# Patient Record
Sex: Male | Born: 1985 | Race: Black or African American | Hispanic: No | Marital: Single | Smoking: Former smoker
Health system: Southern US, Community
[De-identification: ages and names within clinical notes are randomized; demographics above are authoritative.]

## PROBLEM LIST (undated history)

## (undated) ENCOUNTER — Ambulatory Visit (HOSPITAL_COMMUNITY): Discharge status: Absent without leave | Payer: Self-pay

## (undated) DIAGNOSIS — T7840XA Allergy, unspecified, initial encounter: Secondary | ICD-10-CM

## (undated) HISTORY — PX: KNEE SURGERY: SHX244

## (undated) HISTORY — DX: Allergy, unspecified, initial encounter: T78.40XA

---
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
## 2005-11-24 ENCOUNTER — Emergency Department (HOSPITAL_COMMUNITY): Admission: EM | Admit: 2005-11-24 | Discharge: 2005-11-24 | Payer: Self-pay | Admitting: Family Medicine

## 2006-09-21 DIAGNOSIS — L708 Other acne: Secondary | ICD-10-CM | POA: Insufficient documentation

## 2006-09-21 DIAGNOSIS — G43909 Migraine, unspecified, not intractable, without status migrainosus: Secondary | ICD-10-CM | POA: Insufficient documentation

## 2009-03-31 ENCOUNTER — Telehealth: Payer: Self-pay | Admitting: *Deleted

## 2009-04-01 ENCOUNTER — Ambulatory Visit: Payer: Self-pay | Admitting: Family Medicine

## 2009-04-01 DIAGNOSIS — J309 Allergic rhinitis, unspecified: Secondary | ICD-10-CM | POA: Insufficient documentation

## 2009-05-26 ENCOUNTER — Ambulatory Visit: Payer: Self-pay | Admitting: Family Medicine

## 2009-06-16 ENCOUNTER — Encounter (INDEPENDENT_AMBULATORY_CARE_PROVIDER_SITE_OTHER): Payer: Self-pay | Admitting: *Deleted

## 2009-06-16 DIAGNOSIS — F17200 Nicotine dependence, unspecified, uncomplicated: Secondary | ICD-10-CM

## 2009-06-26 ENCOUNTER — Encounter: Payer: Self-pay | Admitting: Family Medicine

## 2009-09-23 ENCOUNTER — Ambulatory Visit: Payer: Self-pay | Admitting: Family Medicine

## 2009-09-23 DIAGNOSIS — A630 Anogenital (venereal) warts: Secondary | ICD-10-CM | POA: Insufficient documentation

## 2009-09-30 ENCOUNTER — Ambulatory Visit: Payer: Self-pay | Admitting: Family Medicine

## 2009-09-30 ENCOUNTER — Encounter: Payer: Self-pay | Admitting: Family Medicine

## 2009-10-08 ENCOUNTER — Ambulatory Visit: Payer: Self-pay | Admitting: Family Medicine

## 2009-10-15 ENCOUNTER — Ambulatory Visit: Payer: Self-pay | Admitting: Family Medicine

## 2009-10-15 ENCOUNTER — Encounter: Payer: Self-pay | Admitting: Family Medicine

## 2009-10-15 LAB — CONVERTED CEMR LAB: Chlamydia, Swab/Urine, PCR: NEGATIVE

## 2009-10-23 ENCOUNTER — Encounter: Payer: Self-pay | Admitting: Family Medicine

## 2009-10-28 ENCOUNTER — Ambulatory Visit: Payer: Self-pay | Admitting: Family Medicine

## 2009-11-03 ENCOUNTER — Encounter: Payer: Self-pay | Admitting: Family Medicine

## 2009-11-16 ENCOUNTER — Telehealth (INDEPENDENT_AMBULATORY_CARE_PROVIDER_SITE_OTHER): Payer: Self-pay | Admitting: *Deleted

## 2010-01-09 ENCOUNTER — Emergency Department (HOSPITAL_COMMUNITY)
Admission: EM | Admit: 2010-01-09 | Discharge: 2010-01-09 | Payer: Self-pay | Source: Home / Self Care | Admitting: Emergency Medicine

## 2010-01-29 ENCOUNTER — Ambulatory Visit: Payer: Self-pay | Admitting: Family Medicine

## 2010-01-29 DIAGNOSIS — M25519 Pain in unspecified shoulder: Secondary | ICD-10-CM

## 2010-02-04 ENCOUNTER — Ambulatory Visit: Payer: Self-pay | Admitting: Family Medicine

## 2010-02-04 DIAGNOSIS — R42 Dizziness and giddiness: Secondary | ICD-10-CM

## 2010-02-11 ENCOUNTER — Ambulatory Visit: Payer: Self-pay | Admitting: Family Medicine

## 2010-02-18 ENCOUNTER — Ambulatory Visit: Payer: Self-pay | Admitting: Family Medicine

## 2010-03-05 ENCOUNTER — Ambulatory Visit: Payer: Self-pay | Admitting: Family Medicine

## 2010-03-24 ENCOUNTER — Ambulatory Visit: Payer: Self-pay | Admitting: Family Medicine

## 2010-04-05 ENCOUNTER — Ambulatory Visit: Payer: Self-pay | Admitting: Family Medicine

## 2010-04-29 ENCOUNTER — Ambulatory Visit: Payer: Self-pay | Admitting: Family Medicine

## 2010-06-29 ENCOUNTER — Ambulatory Visit: Payer: Self-pay | Admitting: Family Medicine

## 2010-06-29 DIAGNOSIS — J329 Chronic sinusitis, unspecified: Secondary | ICD-10-CM | POA: Insufficient documentation

## 2010-07-01 ENCOUNTER — Emergency Department (HOSPITAL_COMMUNITY): Admission: EM | Admit: 2010-07-01 | Discharge: 2009-09-05 | Payer: Self-pay | Admitting: Emergency Medicine
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
## 2010-08-26 NOTE — Assessment & Plan Note (Signed)
Summary: apply solution for warts/eo   Vital Signs:  Patient profile:   25 year old male Height:      68.25 inches Weight:      187.2 pounds BMI:     28.36 Temp:     98.0 degrees F oral Pulse rate:   54 / minute BP sitting:   123 / 77  (right arm) Cuff size:   regular  Vitals Entered By: Colton Grams LPN (October 08, 2009 1:36 PM) CC: solution 4 warts Is Patient Diabetic? No Pain Assessment Patient in pain? no        Primary Care Provider:  Bobby Rumpf   CC:  solution 4 warts.  History of Present Illness: 1) Genital warts: Seen on 09/23/09 for genital warts. Started podophyllin application weekly. Reports some mild scaling and itching with podophyllin but warts have gotten smaller. Now using protection. Monogamous w/ girlfriend. Now using protection. Denies penile pain, discharge, fever, chills, weight loss, groin pain, lymph node swelling, oral lesions, lesions elsewhere.      Current Medications (verified): 1)  Cromolyn Sodium 4 % Soln (Cromolyn Sodium) .Marland Kitchen.. 1 Spray Each Nostril Three Times Daily For Allergic Rhinitis. Dispense Qs For 1 Month 2)  Diphenhydramine Hcl 50 Mg Caps (Diphenhydramine Hcl) .... Take One Pill By Mouth At Bedtime 3)  Flonase 50 Mcg/act Susp (Fluticasone Propionate) .... 2 Sprays Per Nostril Once A Day  Allergies (verified): No Known Drug Allergies  Physical Exam  General:  well nourished, well appearing, alert  Genitalia:  Testes bilaterally descended without nodularity, tenderness or masses. No scrotal masses or lesions. Four, now  ~60mm skin colored warty lesions along penile shaft, now dessicated appearing. Mild scaling at sites of application of podophyllin.    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 Will treat once a week x 1 month with podophyllin. Treatment #3 today. Advised that girlfriend shoud get yearly Pap smears, discuss with her OB/GYN getting  Gardisil. Advised to use protection with intercourse. Patient expressed understanding.   Orders: Wart Destruct <14 (17110)  Complete Medication List: 1)  Cromolyn Sodium 4 % Soln (Cromolyn sodium) .Marland Kitchen.. 1 spray each nostril three times daily for allergic rhinitis. dispense qs for 1 month 2)  Diphenhydramine Hcl 50 Mg Caps (Diphenhydramine hcl) .... Take one pill by mouth at bedtime 3)  Flonase 50 Mcg/act Susp (Fluticasone propionate) .... 2 sprays per nostril once a day   Procedure Note Last Tetanus: Done. (01/23/2004)  Cyst Removal:    Comment: Wart Removal: Date of onset: 07/27/2009 Onset of lesion: 2 months Indication: Genital warts  Consent signed: yes  Procedure # 1: wart destruction    Region: dorsal    Location: penis    Technique: topical podophylin    Anesthesia: none     Comment: Tolerated well.   Instructions: RTC in 7-10 days Additional Instructions: Treatment #3

## 2010-08-26 NOTE — Assessment & Plan Note (Signed)
Summary: apply solution for warts/eo   Vital Signs:  Patient profile:   25 year old male Height:      68.25 inches Weight:      191 pounds BMI:     28.93 Temp:     98.1 degrees F oral Pulse rate:   96 / minute BP sitting:   142 / 85  (right arm) Cuff size:   regular  Vitals Entered By: Garen Grams LPN (October 15, 2009 2:38 PM) CC: treatment for warts; allergic rhinitis Is Patient Diabetic? No Pain Assessment Patient in pain? no        Primary Care Provider:  Bobby Rumpf   CC:  treatment for warts; allergic rhinitis.  History of Present Illness: 1) Genital warts: Seen on 09/23/09 for genital warts. Started podophyllin application weekly. Reports some mild scaling and itching with podophyllin but warts have gotten smaller. Now using protection. Monogamous w/ girlfriend. Now using protection. Denies penile pain, discharge, fever, chills, weight loss, groin pain, lymph node swelling, oral lesions, lesions elsewhere. Would like other testing for STDs - last tests were approximately one year ago and were all negative per patient report.   2) Allergic rhinitis: Little relief with trial of flonase + nasalcrom + antihistamines (over the counter claritin and benadryl). Has tried Neti pot w/o relief. Went to ER  ~ one month ago for continued symptoms. Was told to stop taking Nasalcrom, Flonase, Benadryl and to start using Neti Pot. Daily symptoms all year round, but worse in spring. Mild sinus pressure w/o pain. Clear rhinorrhea and itchy, teary eyes. Denies fever, wheezing, dyspnea, weight loss, appetite change, ear pain, recent nasal or head trauma (though did have nasal fracture four years ago).    Habits & Providers  Alcohol-Tobacco-Diet     Tobacco Status: current  Current Medications (verified): 1)  Cromolyn Sodium 4 % Soln (Cromolyn Sodium) .Marland Kitchen.. 1 Spray Each Nostril Three Times Daily For Allergic Rhinitis. Dispense Qs For 1 Month 2)  Diphenhydramine Hcl 50 Mg Caps (Diphenhydramine  Hcl) .... Take One Pill By Mouth At Bedtime 3)  Flonase 50 Mcg/act Susp (Fluticasone Propionate) .... 2 Sprays Per Nostril Once A Day  Allergies (verified): No Known Drug Allergies  Physical Exam  General:  well nourished, well appearing, alert  Head:  no sinus tenderness  Eyes:  no conjunctivitis but copious clear discharge bilaterally  Ears:  clear TMs and canals bilaterally  Nose:  mild congestion, pale swollen mucosa w/ right nasal polyp  Mouth:  no erythema or exudate, some posterior pharyngeal mucus no oral lesions  Neck:  no lymphadenopathy   Lungs:  CTAB  Heart:  RRR, no murmurs  Abdomen:  Bowel sounds positive,abdomen soft and non-tender without masses, organomegaly or hernias noted. Genitalia:  Testes bilaterally descended without nodularity, tenderness or masses. No scrotal masses or lesions. Four, now  < 1mm skin colored warty lesions along penile shaft, now dessicated appearing. Mild scaling at sites of application of podophyllin.  Msk:  5/5 strength all extremities.  Pulses:  2+ radials  Extremities:  No clubbing, cyanosis, edema, or deformity noted with normal full range of motion of all joints.   Neurologic:  alert & oriented X3, cranial nerves II-XII intact, strength normal in all extremities, sensation intact to light touch, and DTRs symmetrical and normal.   Skin:  see genitalia exam o/w normal    Impression & Recommendations:  Problem # 1:  SEXUALLY TRANSMITTED DISEASE, EXPOSURE TO (ICD-V01.6) Assessment Comment Only At patient request will  check RPR, HIV, GC/CT (urine).  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 Will end podophyillin treatment today (s/p four treatments). Advised that girlfriend shoud get yearly Pap smears, discuss with her OB/GYN getting Gardisil. Advised to use protection with  intercourse. Patient expressed understanding.   Orders: [facility]- Est  Level 4 (99214) Wart Destruct <14 (17110)  Problem # 3:  ALLERGIC RHINITIS (ICD-477.9) Assessment: Unchanged  Will refer to ENT given failed treatment, nasal polyp on exam. Advised to continue medications until seen by ENT.   His updated medication list for this problem includes:    Diphenhydramine Hcl 50 Mg Caps (Diphenhydramine hcl) .Marland Kitchen... Take one pill by mouth at bedtime    Flonase 50 Mcg/act Susp (Fluticasone propionate) .Marland Kitchen... 2 sprays per nostril once a day  Orders: ENT Referral (ENT) [facility]- Est  Level 4 (57322)  Complete Medication List: 1)  Cromolyn Sodium 4 % Soln (Cromolyn sodium) .Marland Kitchen.. 1 spray each nostril three times daily for allergic rhinitis. dispense qs for 1 month 2)  Diphenhydramine Hcl 50 Mg Caps (Diphenhydramine hcl) .... Take one pill by mouth at bedtime 3)  Flonase 50 Mcg/act Susp (Fluticasone propionate) .... 2 sprays per nostril once a day  Patient Instructions: 1)  Go to ENT to be evaluated for your symptoms. We will call you with the referral. 2)  Follow up in 6 months.  3)  We will mail you a copy of all your test results.    Procedure Note Last Tetanus: Done. (01/23/2004)  Cyst Removal:    Comment: Date of onset: 07/27/2009 Onset of lesion: 2 months Indication: Genital warts (4) Consent signed: yes  Procedure # 1: wart destruction    Region: dorsal    Location: penis    Technique: topical podophylin    Anesthesia: none     Comment: Tolerated well.   Instructions: continue to follow Additional Instructions: Treatment #4

## 2010-08-26 NOTE — Assessment & Plan Note (Signed)
Summary: congestion,df   Vital Signs:  Patient profile:   25 year old male Height:      68.25 inches Weight:      180.7 pounds BMI:     27.37 Temp:     98.0 degrees F oral Pulse rate:   65 / minute BP sitting:   127 / 73  (left arm) Cuff size:   regular  Vitals Entered By: Garen Grams LPN (June 29, 2010 3:20 PM) CC: head congestion x 2 weeks Is Patient Diabetic? No Pain Assessment Patient in pain? no        Primary Care Provider:  Bobby Rumpf   CC:  head congestion x 2 weeks.  History of Present Illness: 1) Nasal / sinus congestion: x 2 weeks. Clear to yellow nasal discharge, productive cough with yellow sputum at times., sinus pressure. Symptoms worse indoors, but not affected by time of day. +itchy nose. +rhinorrhea. History of chronic allergic rhinitis. Not on medications currently - thinks he was on inhaled steroid which worked for his symptoms from ENT but ran out a while ago. Has been tried on NasalCrom,  over the counter antihistamines without relief.   ROS: Denies fever, chills, nausea, vomiting or diarrhea    Habits & Providers  Alcohol-Tobacco-Diet     Tobacco Status: never     Tobacco Counseling: to quit use of tobacco products     Cigarette Packs/Day: 0.25  Current Medications (verified): 1)  None  Allergies (verified): No Known Drug Allergies  Physical Exam  General:  well nourished, well appearing, alert  Head:  +sinus tenderness of maxillae bilaterally (not over frontal)   Eyes:  no conjunctivitis  Ears:  clear TMs and canals bilaterally  Nose:  +ve congestion, pale swollen mucosa w/ right nasal polyp  Mouth:  no erythema or exudate, +post nasal drip  Neck:  supple and full ROM.   Lungs:  CTAB    Impression & Recommendations:  Problem # 1:  SINUSITIS (ICD-473.9) Assessment New  Will treat as bacterial sinusitis given symptom duration. Will write for Flonase as well. Follow up as needed.  The following medications were removed from the  medication list:    Flonase 50 Mcg/act Susp (Fluticasone propionate) .Marland Kitchen... 2 sprays per nostril once a day His updated medication list for this problem includes:    Amoxicillin 875 Mg Tabs (Amoxicillin) ..... One tab by mouth two times a day x 10 days    Flonase 50 Mcg/act Susp (Fluticasone propionate) ..... One spray each nostril two times a day. one bottle  Orders: [facility]- Est Level  3 (16109)  Complete Medication List: 1)  Amoxicillin 875 Mg Tabs (Amoxicillin) .... One tab by mouth two times a day x 10 days 2)  Flonase 50 Mcg/act Susp (Fluticasone propionate) .... One spray each nostril two times a day. one bottle  Patient Instructions: 1)  Take the antibiotic as prescribed. 2)  Use the flonase a prescribed when you are able to afford it. Follow up as needed. 3)  You can take over the counter zyrtec or allegra for allergy symptoms as well.  Prescriptions: FLONASE 50 MCG/ACT SUSP (FLUTICASONE PROPIONATE) one spray each nostril two times a day. one bottle  #1 x 3   Entered and Authorized by:   Bobby Rumpf  MD   Signed by:   Bobby Rumpf  MD on 06/29/2010   Method used:   Print then Give to Patient   RxID:   [id number] AMOXICILLIN 875 MG TABS (AMOXICILLIN)  one tab by mouth two times a day x 10 days  #20 x 0   Entered and Authorized by:   Bobby Rumpf  MD   Signed by:   Bobby Rumpf  MD on 06/29/2010   Method used:   Print then Give to Patient   RxID:   [id number]    Orders Added: 1)  [facility]- Est Level  3 [14782]

## 2010-08-26 NOTE — Assessment & Plan Note (Signed)
Summary: f/u eo   Vital Signs:  Patient profile:   25 year old male Height:      68.25 inches Weight:      175.2 pounds BMI:     26.54 Temp:     97.6 degrees F oral Pulse rate:   64 / minute BP sitting:   133 / 84  (left arm) Cuff size:   regular  Vitals Entered By: Garen Grams LPN (March 05, 2010 9:03 AM) CC: wart treatment Is Patient Diabetic? No Pain Assessment Patient in pain? no        Primary Care Provider:  Bobby Rumpf   CC:  wart treatment.  History of Present Illness: 1) Genital warts: History of genital warts treated successfully with podophyllin this year, now warts have returned in the same places. Treated one week ago (4th treatment in series). Monogamous with girlfriend. Lesions had been improving in size but appear static since last treatment  Denies penile pain, discharge, fever, chills, weight loss, groin pain, lymph node swelling, oral lesions, lesions elsewhere.  see prior meds for med rec.  ROS as above     Habits & Providers  Alcohol-Tobacco-Diet     Tobacco Status: never  Allergies: No Known Drug Allergies  Physical Exam  General:  well nourished, well appearing, alert  Genitalia:  Testes bilaterally descended without nodularity, tenderness or masses. No scrotal masses or lesions. 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 discussed other potential treatments including freezing. Podophyillin treatment today (5th treatment) . Advised regarding safe sexual practices. Follow up one week for re-treatment.   Orders: Post-Op Check (47829)  Complete Medication List: 1)  Cromolyn Sodium 4 % Soln (Cromolyn sodium) .Marland Kitchen.. 1 spray each nostril three times daily for allergic rhinitis. dispense qs for 1 month 2)  Diphenhydramine Hcl 50 Mg Caps (Diphenhydramine hcl) .... Take  one pill by mouth at bedtime 3)  Flonase 50 Mcg/act Susp (Fluticasone propionate) .... 2 sprays per nostril once a day 4)  Podophyllin  .... Apply in office q week for treatment 5)  Ibuprofen 800 Mg Tabs (Ibuprofen) .... One tab by mouth three times a day as needed pain 6)  Flexeril 5 Mg Tabs (Cyclobenzaprine hcl) .... One tab by mouth at bedtime as needed for shoulder stiffness or pain Prescriptions: FLEXERIL 5 MG TABS (CYCLOBENZAPRINE HCL) one tab by mouth at bedtime as needed for shoulder stiffness or pain  #30 x 1   Entered and Authorized by:   Bobby Rumpf  MD   Signed by:   Bobby Rumpf  MD on 03/05/2010   Method used:   Electronically to        [facility] [phone]* (retail)       [address]       [place], [place]  [address]       Ph: [phone]       Fax: [phone]   RxID:   [id number] IBUPROFEN 800 MG TABS (IBUPROFEN) one tab by mouth three times a day as needed pain  #30 x 1   Entered and Authorized by:   Bobby Rumpf  MD   Signed by:   Bobby Rumpf  MD on 03/05/2010   Method used:   Electronically to        [facility] [phone]* (retail)  [address]       [place], [place]  [address]       Ph: [phone]       Fax: [phone]   RxID:   [id number]    Procedure Note Last Tetanus: Done. (01/23/2004)  Cyst Removal: The patient denies swelling.  Wart Removal:    Comment: Comment: Procedure # 1: wart destruction    Technique: topical podophylin    Comment: Appplied topical podophyllin to penile warts. Tolerated procedure well. Advised regarding aftercare. (5th treatment)

## 2010-08-26 NOTE — Assessment & Plan Note (Signed)
Summary: f/up,tcb   Primary Care Provider:  Bobby Rumpf   CC:  genital warts .  History of Present Illness: 1) Genital warts: History of genital warts treated successfully with podophyllin this year, now warts have returned in the same places. Treated one week ago (7th treatment in series). Monogamous with girlfriend. Lesions had been improving in size but had appeared static after 4th treatment, now have begun to improve again per patient with podophyllin / liquid N2 application  Denies penile pain, discharge, fever, chills, weight loss, groin pain, lymph node swelling, oral lesions, lesions elsewhere.   2) Prevention: Flu shot today. Requests dental appointment for general check.   see prior meds for med rec.  ROS as above     Current Medications (verified): 1)  Cromolyn Sodium 4 % Soln (Cromolyn Sodium) .Marland Kitchen.. 1 Spray Each Nostril Three Times Daily For Allergic Rhinitis. Dispense Qs For 1 Month 2)  Diphenhydramine Hcl 50 Mg Caps (Diphenhydramine Hcl) .... Take One Pill By Mouth At Bedtime 3)  Flonase 50 Mcg/act Susp (Fluticasone Propionate) .... 2 Sprays Per Nostril Once A Day 4)  Podophyllin .... Apply in Office Q Week For Treatment 5)  Ibuprofen 800 Mg Tabs (Ibuprofen) .... One Tab By Mouth Three Times A Day As Needed Pain 6)  Flexeril 5 Mg Tabs (Cyclobenzaprine Hcl) .... One Tab By Mouth At Bedtime As Needed For Shoulder Stiffness or Pain  Allergies (verified): No Known Drug Allergies  Past History:  Past Medical History: Last updated: 09/23/2009 nasal fracture 10/28/04 chronic rhinitis   Family History: Last updated: 09/21/2006 Father -  14  healthy, Mother - 21 ; HTN  and asthma  Physical Exam  General:  well nourished, well appearing, alert  Mouth:  good dentition, mild moderate plaque, no caries noted  Genitalia:  Testes bilaterally descended without nodularity, tenderness or masses. No scrotal masses or lesions. 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 Advised regarding safe sexual practices. Will conclude treatment today.   Orders: Post-Op Check ([address])  Problem # 2:  Preventive Health Care (ICD-V70.0) Flu shot today, dental referral for general exam.   Complete Medication List: 1)  Cromolyn Sodium 4 % Soln (Cromolyn sodium) .Marland Kitchen.. 1 spray each nostril three times daily for allergic rhinitis. dispense qs for 1 month 2)  Diphenhydramine Hcl 50 Mg Caps (Diphenhydramine hcl) .... Take one pill by mouth at bedtime 3)  Flonase 50 Mcg/act Susp (Fluticasone propionate) .... 2 sprays per nostril once a day 4)  Podophyllin  .... Apply in office q week for treatment 5)  Ibuprofen 800 Mg Tabs (Ibuprofen) .... One tab by mouth three times a day as needed pain 6)  Flexeril 5 Mg Tabs (Cyclobenzaprine hcl) .... One tab by mouth at bedtime as needed for shoulder stiffness or pain  Other Orders: Dental Referral (Dentist)  Appended Document: f/up,tcb   Orders Added: 1)  Influenza Vaccine NON MCR [00028]    Immunizations Administered:  Influenza Vaccine # 1:    Vaccine Type: Fluvax Non-MCR    Site: right deltoid    Mfr: GlaxoSmithKline    Dose: 0.5 ml    Route: IM    Given by: Jone Baseman CMA    Exp. Date: 01/19/2011    Lot #: [id number]  VIS given: 02/16/10 version given April 29, 2010.  Flu Vaccine Consent Questions:    Do you have a history of severe allergic reactions to this vaccine? no    Any prior history of allergic reactions to egg and/or gelatin? no    Do you have a sensitivity to the preservative Thimersol? no    Do you have a past history of Guillan-Barre Syndrome? no    Do you currently have an acute febrile illness? no    Have you ever had a severe reaction to latex? no    Vaccine  information given and explained to patient? yes

## 2010-08-26 NOTE — Miscellaneous (Signed)
Summary: Orders Update  Clinical Lists Changes  Orders: Added new Service order of Wart Destruct <14 ([address]) - Signed

## 2010-08-26 NOTE — Assessment & Plan Note (Signed)
Summary: F/U/KH   Primary Care [name]:  Colton Flores    History of Present Illness: 1) Genital warts: History of genital warts treated successfully with podophyllin this year, now warts have returned in the same places. Treated two wees ago (5th treatment in series). Monogamous with girlfriend. Lesions had been improving in size but had appeared static after 4th treatment, now have begun to improve again per patient.  Denies penile pain, discharge, fever, chills, weight loss, groin pain, lymph node swelling, oral lesions, lesions elsewhere.  see prior meds for med rec.  ROS as above     Current Medications (verified): 1)  Cromolyn Sodium 4 % Soln (Cromolyn Sodium) .Marland Kitchen.. 1 Spray Each Nostril Three Times Daily For Allergic Rhinitis. Dispense Qs For 1 Month 2)  Diphenhydramine Hcl 50 Mg Caps (Diphenhydramine Hcl) .... Take One Pill By Mouth At Bedtime 3)  Flonase 50 Mcg/act Susp (Fluticasone Propionate) .... 2 Sprays Per Nostril Once A Day 4)  Podophyllin .... Apply in Office Q Week For Treatment 5)  Ibuprofen 800 Mg Tabs (Ibuprofen) .... One Tab By Mouth Three Times A Day As Needed Pain 6)  Flexeril 5 Mg Tabs (Cyclobenzaprine Hcl) .... One Tab By Mouth At Bedtime As Needed For Shoulder Stiffness or Pain  Allergies (verified): No Known Drug Allergies  Physical Exam  Genitalia:  Testes bilaterally descended without nodularity, tenderness or masses. No scrotal masses or lesions. Three (initially 4) skin colored warty lesions along penile shaft (largest of which was 3 mm in size now 1.5 mm in size Additional Exam:    Comment: Comment: Procedure # 1: wart destruction    Technique: topical podophylin, liquid N2 freeze    Comment: Appplied topical podophyllin to penile warts. Used cotton swab to apply liquid N2 to warts with 3 freeze / thaw cycles. Tolerated procedure well. Advised regarding aftercare. (6th treatment)   Impression & Recommendations:  Problem # 1:  CONDYLOMA ACUMINATUM  (ICD-078.11) Assessment Unchanged  Improving slowly. Podophyillin treatment today (5th treatment) . Concurrent N2 liquid freze. Advised regarding safe sexual practices. Follow up one week for re-treatment.   Orders: Post-Op Check (02542)  Complete Medication List: 1)  Cromolyn Sodium 4 % Soln (Cromolyn sodium) .Marland Kitchen.. 1 spray each nostril three times daily for allergic rhinitis. dispense qs for 1 month 2)  Diphenhydramine Hcl 50 Mg Caps (Diphenhydramine hcl) .... Take one pill by mouth at bedtime 3)  Flonase 50 Mcg/act Susp (Fluticasone propionate) .... 2 sprays per nostril once a day 4)  Podophyllin  .... Apply in office q week for treatment 5)  Ibuprofen 800 Mg Tabs (Ibuprofen) .... One tab by mouth three times a day as needed pain 6)  Flexeril 5 Mg Tabs (Cyclobenzaprine hcl) .... One tab by mouth at bedtime as needed for shoulder stiffness or pain

## 2010-08-26 NOTE — Assessment & Plan Note (Signed)
Summary: apply solution for warts/eo   Allergies: No Known Drug Allergies   Complete Medication List: 1)  Cromolyn Sodium 4 % Soln (Cromolyn sodium) .Marland Kitchen.. 1 spray each nostril three times daily for allergic rhinitis. dispense qs for 1 month 2)  Diphenhydramine Hcl 50 Mg Caps (Diphenhydramine hcl) .... Take one pill by mouth at bedtime 3)  Flonase 50 Mcg/act Susp (Fluticasone propionate) .... 2 sprays per nostril once a day  Other Orders: No Charge Patient Arrived (NCPA0) (NCPA0)
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
## 2010-08-26 NOTE — Miscellaneous (Signed)
Summary: Re: ENT referral  Clinical Lists Changes   received notification from [facility] that they are unable to complete referral for ENT at this time due to lack of volunteer physicians in this speciality group. they will notify patient when they can process referral. Theresia Lo RN  November 03, 2009 9:04 AM

## 2010-08-26 NOTE — Assessment & Plan Note (Signed)
Summary: apply solution for warts/eo   Vital Signs:  Patient profile:   25 year old male Height:      68.25 inches Weight:      188 pounds BMI:     28.48 Temp:     98.1 degrees F oral Pulse rate:   53 / minute BP sitting:   127 / 84  (right arm) Cuff size:   regular  Vitals Entered By: Garen Grams LPN (September 30, 452 1:54 PM) CC: f/u warts Is Patient Diabetic? No Pain Assessment Patient in pain? no        Primary Care Provider:  Bobby Rumpf   CC:  f/u warts.  History of Present Illness: 1) Genital warts: Seen on 09/23/09 for genital warts. Started podophyllin application weekly. Reports some mild scaling and itching with podophyllin but warts have gotten smaller. Now using protection. Here with his girlfrien; she gets yearly Pap smears and they have been normal. Not immunized with Gardisil. Monogamous w/ girlfriend. Does not use protection. Denies penile pain, discharge, fever, chills, weight loss, groin pain, lymph node swelling, oral lesions, lesions elsewhere.      Allergies: No Known Drug Allergies  Physical Exam  General:  well nourished, well appearing, alert  Genitalia:  Testes bilaterally descended without nodularity, tenderness or masses. No scrotal masses or lesions. Four, now  ~22mm skin colored warty lesions along penile shaft, now dessicated appearing. Mild scaling at sites of application of podophyllin.  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 Treatment #2 today. Advised that girlfriend shoud get yearly Pap smears, discuss with her OB/GYN getting Gardisil. Advised to use protection with intercourse. Patient expressed understanding.   Complete Medication List: 1)  Cromolyn Sodium 4 % Soln (Cromolyn sodium) .Marland Kitchen.. 1 spray each nostril three times daily for allergic rhinitis. dispense qs for 1 month 2)   Diphenhydramine Hcl 50 Mg Caps (Diphenhydramine hcl) .... Take one pill by mouth at bedtime 3)  Flonase 50 Mcg/act Susp (Fluticasone propionate) .... 2 sprays per nostril once a day   Procedure Note Last Tetanus: Done. (01/23/2004)  Wart Removal: Date of onset: 07/27/2009 Onset of lesion: 2 months Indication: Genital warts  Consent signed: yes  Procedure # 1: wart destruction    Region: dorsal    Location: penis    Technique: topical podophylin    Anesthesia: none     Comment: Tolerated well.   Instructions: RTC in 7-10 days Additional Instructions: Treatment #2

## 2010-08-26 NOTE — Assessment & Plan Note (Signed)
Summary: Colton Flores,Colton Flores   Vital Signs:  Patient profile:   25 year old male Height:      68.25 inches Weight:      188.4 pounds BMI:     28.54 Temp:     98.2 degrees F oral Pulse rate:   70 / minute BP sitting:   142 / 84  (left arm) Cuff size:   regular  Vitals Entered By: Garen Grams LPN (September 23, 1608 1:47 PM) CC: Colton Flores Is Patient Diabetic? No Pain Assessment Patient in pain? no        Primary Care Provider:  Bobby Rumpf   CC:  Colton Flores.  History of Present Illness: 1) Chronic rhinitis: Went to ER  ~ one month ago for continued symptoms. Was told to stop taking Nasalcrom, Flonase, Benadryl and to start using Neti Pot. Noticed some relief with this initially then symptoms returned. Was referred to ENT but did not go. Has had some relief as well with above medications (was not always compliant) but still having daily symptoms.  Mild sinus pressure w/o pain. Clear rhinorrhea and itchy, teary eyes. Denies fever, wheezing, dyspnea, weight loss, appetite change, ear pain, recent nasal or head trauma (though did have nasal fracture four years ago).   2) Tobacco use: Went to a hookah bar x 1. Does not smoke chronically  3) "Bumps" on penis: Reports 4 small "mushroom"-like bumps on penis. First appeared 2 months ago. Painless. Sexually active. Monogamous w/ girlfriend. Does not use protection. Denies penile pain, discharge, fever, chills, weight loss, groin pain, lymph node swelling, oral lesions, lesions elsewhere.      Habits & Providers  Alcohol-Tobacco-Diet     Tobacco Status: current     Cigarette Packs/Day: 0.25  Current Medications (verified): 1)  Cromolyn Sodium 4 % Soln (Cromolyn Sodium) .Marland Kitchen.. 1 Spray Each Nostril Three Times Daily For Allergic Rhinitis. Dispense Qs For 1 Month 2)  Diphenhydramine Hcl 50 Mg Caps (Diphenhydramine Hcl) .... Take One Pill By Mouth At Bedtime 3)  Flonase 50 Mcg/act Susp (Fluticasone Propionate) .... 2 Sprays Per Nostril Once A Day  Allergies  (verified): No Known Drug Allergies  Past History:  Family History: Last updated: 09/21/2006 Father -  37  healthy, Mother - 39 ; HTN  and asthma  Social History: Last updated: 09/23/2009 Lives in [place] w/ his mother. 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 Plays flag football for fun, has started running as well. Trying to get a job as well.  Hookah bar x 1; does not smoke or use other tobacco products otherwise. Sexually active, monogamous w/ girlfriend, does not use protection.  Enjoys sports and video games.   Past Medical History: nasal fracture 10/28/04 chronic rhinitis   Past Surgical History: none   Social History: Lives in [place] w/ his mother. 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 Plays flag football for fun, has started running as well. Trying to get a job as well.  Hookah bar x 1; does not smoke or use other tobacco products otherwise. Sexually active, monogamous w/ girlfriend, does not use protection.  Enjoys sports and video games. Packs/Day:  0.25  Physical Exam  General:  well nourished, well appearing, alert  Head:  no sinus tenderness  Eyes:  no conjunctivitis but copious clear discharge bilaterally  Ears:  clear TMs and canals bilaterally  Nose:  mild congestion, swollen mucosa w/ rigfht nasal polyp  Mouth:  no erythema or exudate, some posterior pharyngeal mucus and cobblestoning  no oral lesions  Neck:  no lymphadenopathy   Lungs:  CTAB  Heart:  RRR, no murmurs  Abdomen:  Bowel sounds positive,abdomen soft and non-tender without masses, organomegaly or hernias noted. Rectal:    Genitalia:  Testes bilaterally descended without nodularity, tenderness or masses. No scrotal masses or lesions. Four  ~ 2mm skin colored warty lesions along penile shaft.  Msk:  5/5 strength all extremities.  Pulses:  2+ radials  Extremities:  No clubbing, cyanosis, edema, or deformity noted with normal full range of motion of all joints.   Neurologic:  alert & oriented X3, cranial nerves II-XII  intact, strength normal in all extremities, sensation intact to light touch, and DTRs symmetrical and normal.   Skin:  see genitalia exam o/w normal  Inguinal Nodes:  No groin lymphadenopathy     Impression & Recommendations:  Problem # 1:  ALLERGIC RHINITIS (ICD-477.9) Assessment Unchanged Advised to restart Nasalcrom, flonase, benadryl at night. Will follow one month. Consider referral to ENT if not improving.  His updated medication list for this problem includes:    Diphenhydramine Hcl 50 Mg Caps (Diphenhydramine hcl) .Marland Kitchen... Take one pill by mouth at bedtime    Flonase 50 Mcg/act Susp (Fluticasone propionate) .Marland Kitchen... 2 sprays per nostril once a day  Problem # 2:  TOBACCO USER (ICD-305.1) Assessment: New Single visit to hookah bar. Advised against tobacco products. Patient in agreement.   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 Advised that girlfriend shoud get yearly Pap smears, discuss with her OB/GYN getting Gardisil if she has not already done so. Advised to use protection with intercourse. Patient expressed understanding.   Problem # 4:  PHYSICAL EXAMINATION (ICD-V70.0) Assessment: Comment Only  Orders: [facility] - Est  18-39 yrs (64403)  Reviewed preventive care protocols, scheduled due services, and updated immunizations.  Complete Medication List: 1)  Cromolyn Sodium 4 % Soln (Cromolyn sodium) .Marland Kitchen.. 1 spray each nostril three times daily for allergic rhinitis. dispense qs for 1 month 2)  Diphenhydramine Hcl 50 Mg Caps (Diphenhydramine hcl) .... Take one pill by mouth at bedtime 3)  Flonase 50 Mcg/act Susp (Fluticasone propionate) .... 2 sprays per nostril once a day  Patient Instructions: 1)  Wash off solution from penis in 6 hours.  2)  Come back in once a week for the next four weeks to have solution for warts applied. 3)  You may have some mild skin irritation from this solution. Call if you have a  severe reaction to it 4)  Use your nasal sprays as prescribed.  5)  Let your girlfriend know that you have warts and that she should get checked with yearly Pap smears and she can also get the HPV vaccine to help prevent cervical cancer (HPV is the virus that causes warts and can cause cervical cancer in women) 6)  Use condoms for protection when you have intercourse.

## 2010-08-26 NOTE — Assessment & Plan Note (Signed)
Summary: genital warts treatment #4/[facility]   Vital Signs:  Patient profile:   25 year old male Weight:      177.4 pounds  Vitals Entered By: Dennison Nancy RN (February 18, 2010 11:03 AM)  Primary Care Provider:  Bobby Rumpf   CC:  genital warts .  History of Present Illness: 1) Genital warts: History of genital warts treated successfully with podophyllin this year, now warts have returned in the same places. Treated one week ago (3rd treatment in series). Monogamous with girlfriend. Lesions are improving in size.   Denies penile pain, discharge, fever, chills, weight loss, groin pain, lymph node swelling, oral lesions, lesions elsewhere.  see prior meds for med rec.  ROS as above     Current Medications (verified): 1)  Cromolyn Sodium 4 % Soln (Cromolyn Sodium) .Marland Kitchen.. 1 Spray Each Nostril Three Times Daily For Allergic Rhinitis. Dispense Qs For 1 Month 2)  Diphenhydramine Hcl 50 Mg Caps (Diphenhydramine Hcl) .... Take One Pill By Mouth At Bedtime 3)  Flonase 50 Mcg/act Susp (Fluticasone Propionate) .... 2 Sprays Per Nostril Once A Day 4)  Podophyllin .... Apply in Office Q Week For Treatment  Allergies (verified): No Known Drug Allergies  Physical Exam  General:  well nourished, well appearing, alert  Genitalia:  Testes bilaterally descended without nodularity, tenderness or masses. No scrotal masses or lesions. Three (initially 4) now < 1 mm skin colored warty lesions along penile shaft (largest of which was 3 mm in size)  Additional Exam:    Comment: Procedure # 1: wart destruction    Technique: topical podophylin    Comment: Appplied topical podophyllin to penile warts. Tolerated procedure well. Advised regarding aftercare. 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 Podophyillin treatment today (4th treatment) . Advised regarding safe sexual practices. Follow up one week for re-treatment.   Orders: Post-Op  Check (14782)  Complete Medication List: 1)  Cromolyn Sodium 4 % Soln (Cromolyn sodium) .Marland Kitchen.. 1 spray each nostril three times daily for allergic rhinitis. dispense qs for 1 month 2)  Diphenhydramine Hcl 50 Mg Caps (Diphenhydramine hcl) .... Take one pill by mouth at bedtime 3)  Flonase 50 Mcg/act Susp (Fluticasone propionate) .... 2 sprays per nostril once a day 4)  Podophyllin  .... Apply in office q week for treatment

## 2010-08-26 NOTE — Progress Notes (Signed)
Summary: Ref Req  Phone Note Call from Patient Call back at Home Phone [phone] Call back at [phone]   Caller: mom-Tonya Reason for Call: Acute Illness Summary of Call: Has found a ENT for him to see. She is going to pay out of pocket but still may need fererral from [place].  The doctor is Hermelinda Medicus at [phone].  They have already an appt for this Friday at 1:00 can we send any records that they may need. Initial call taken by: Clydell Hakim,  November 16, 2009 9:10 AM  Follow-up for Phone Call        called Dr. Haroldine Laws office and agve referral  and records faxed. Follow-up by: Theresia Lo RN,  November 16, 2009 10:14 AM

## 2010-08-26 NOTE — Miscellaneous (Signed)
Summary: ROI  ROI   Imported By: Bradly Bienenstock 10/23/2009 10:17:37  _____________________________________________________________________  External Attachment:    Type:   Image     Comment:   External Document

## 2010-08-26 NOTE — Assessment & Plan Note (Signed)
Summary: f/u eo   Primary Care Provider:  Bobby Rumpf    History of Present Illness: 1) Genital warts: History of genital warts treated successfully with podophyllin this year, now warts have returned in the same places. Treated one week ago (6th treatment in series). Monogamous with girlfriend. Lesions had been improving in size but had appeared static after 4th treatment, now have begun to improve again per patient with podophyllin / liquid N2 application  Denies penile pain, discharge, fever, chills, weight loss, groin pain, lymph node swelling, oral lesions, lesions elsewhere.  see prior meds for med rec.  ROS as above     Current Medications (verified): 1)  Cromolyn Sodium 4 % Soln (Cromolyn Sodium) .Marland Kitchen.. 1 Spray Each Nostril Three Times Daily For Allergic Rhinitis. Dispense Qs For 1 Month 2)  Diphenhydramine Hcl 50 Mg Caps (Diphenhydramine Hcl) .... Take One Pill By Mouth At Bedtime 3)  Flonase 50 Mcg/act Susp (Fluticasone Propionate) .... 2 Sprays Per Nostril Once A Day 4)  Podophyllin .... Apply in Office Q Week For Treatment 5)  Ibuprofen 800 Mg Tabs (Ibuprofen) .... One Tab By Mouth Three Times A Day As Needed Pain 6)  Flexeril 5 Mg Tabs (Cyclobenzaprine Hcl) .... One Tab By Mouth At Bedtime As Needed For Shoulder Stiffness or Pain  Allergies (verified): No Known Drug Allergies  Physical Exam  General:  well nourished, well appearing, alert  Genitalia:  Testes bilaterally descended without nodularity, tenderness or masses. No scrotal masses or lesions. Two (initially 4) skin colored warty lesions along penile shaft (largest of which was 3 mm in size) now 1 mm in size Additional Exam:    Comment: Comment: Procedure # 1: wart destruction    Technique: topical podophylin, liquid N2 freeze    Comment: Appplied topical podophyllin to penile warts. Used cotton swab to apply liquid N2 to warts with 3 freeze / thaw cycles. Tolerated procedure well. Advised regarding aftercare. 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 Advised regarding safe sexual practices. Follow up one week for re-treatment.   Orders: Post-Op Check (04540)  Complete Medication List: 1)  Cromolyn Sodium 4 % Soln (Cromolyn sodium) .Marland Kitchen.. 1 spray each nostril three times daily for allergic rhinitis. dispense qs for 1 month 2)  Diphenhydramine Hcl 50 Mg Caps (Diphenhydramine hcl) .... Take one pill by mouth at bedtime 3)  Flonase 50 Mcg/act Susp (Fluticasone propionate) .... 2 sprays per nostril once a day 4)  Podophyllin  .... Apply in office q week for treatment 5)  Ibuprofen 800 Mg Tabs (Ibuprofen) .... One tab by mouth three times a day as needed pain 6)  Flexeril 5 Mg Tabs (Cyclobenzaprine hcl) .... One tab by mouth at bedtime as needed for shoulder stiffness or pain

## 2010-10-10 LAB — POCT I-STAT, CHEM 8
Calcium, Ion: 1.19 mmol/L (ref 1.12–1.32)
Chloride: 107 mEq/L (ref 96–112)
Glucose, Bld: 122 mg/dL — ABNORMAL HIGH (ref 70–99)
Hemoglobin: 15.3 g/dL (ref 13.0–17.0)
Potassium: 3.9 mEq/L (ref 3.5–5.1)
TCO2: 26 mmol/L (ref 0–100)

## 2010-10-10 LAB — CBC
HCT: 43.9 % (ref 39.0–52.0)
Platelets: 211 10*3/uL (ref 150–400)
RBC: 5.4 MIL/uL (ref 4.22–5.81)
WBC: 7.8 10*3/uL (ref 4.0–10.5)

## 2010-10-10 LAB — COMPREHENSIVE METABOLIC PANEL
ALT: 70 U/L — ABNORMAL HIGH (ref 0–53)
AST: 35 U/L (ref 0–37)
Albumin: 3.9 g/dL (ref 3.5–5.2)
Alkaline Phosphatase: 72 U/L (ref 39–117)
BUN: 15 mg/dL (ref 6–23)
GFR calc non Af Amer: 59 mL/min — ABNORMAL LOW (ref >60)
Potassium: 3.9 mEq/L (ref 3.5–5.1)
Sodium: 137 mEq/L (ref 135–145)
Total Bilirubin: 0.4 mg/dL (ref 0.3–1.2)

## 2010-10-10 LAB — URINALYSIS, ROUTINE W REFLEX MICROSCOPIC
Hgb urine dipstick: NEGATIVE
Nitrite: NEGATIVE
Specific Gravity, Urine: 1.028 (ref 1.005–1.030)
Urobilinogen, UA: 0.2 mg/dL (ref 0.0–1.0)
pH: 7.5 (ref 5.0–8.0)

## 2010-10-10 LAB — PROTIME-INR: INR: 1.1 (ref 0.00–1.49)

## 2010-10-10 LAB — RAPID URINE DRUG SCREEN, HOSP PERFORMED
Barbiturates: NOT DETECTED
Benzodiazepines: NOT DETECTED
Cocaine: NOT DETECTED

## 2010-10-10 LAB — ABO/RH: ABO/RH(D): A POS

## 2010-10-10 LAB — TYPE AND SCREEN: Antibody Screen: NEGATIVE

## 2010-10-10 LAB — LACTIC ACID, PLASMA: Lactic Acid, Venous: 2 mmol/L (ref 0.5–2.2)

## 2010-11-17 ENCOUNTER — Ambulatory Visit (INDEPENDENT_AMBULATORY_CARE_PROVIDER_SITE_OTHER): Payer: Self-pay | Admitting: Family Medicine

## 2010-11-17 ENCOUNTER — Encounter: Payer: Self-pay | Admitting: Family Medicine

## 2010-11-17 VITALS — BP 129/82 | HR 69 | Temp 98.0°F | Ht 67.0 in | Wt 188.2 lb

## 2010-11-17 DIAGNOSIS — Z20828 Contact with and (suspected) exposure to other viral communicable diseases: Secondary | ICD-10-CM

## 2010-11-17 DIAGNOSIS — J329 Chronic sinusitis, unspecified: Secondary | ICD-10-CM

## 2010-11-17 MED ORDER — FLUTICASONE PROPIONATE 50 MCG/ACT NA SUSP: 2.0000 | Freq: Two times a day (BID) | NASAL | Status: DC

## 2010-11-17 NOTE — Progress Notes (Signed)
  Subjective:    Patient ID: Colton Flores, male    DOB: August 13, 1985, 25 y.o.   MRN: [id number]  HPI  1) Chronic rhinitis: Relief with Flonase, but ran out several months ago. Has tried Zyrtec and Claritin without relief. Has been referred to ENT in the past as well without further recommendations. Symptoms worse indoors and in colder weather. Reports sneezing, postnasal drip, clear rhinorrhea. Denies fever, chills, sinus pain. Has been on Nasalcrom, Benadryl, Zyrtec, Claritin, nasal saline.   2) STD testing: Would like STD testing - gets tested yearly. History of genital warts (treated). Denies fever, chills, weight loss, night sweats, penile lesions, discharge. Sexually active, monogamous with girlfriend.   Pertinent history reviewed.   Review of Systems As per HPI      Objective:   Physical Exam General:  well nourished, well appearing, alert  Ears:  clear TMs and canals bilaterally  Nose:  mild congestion, pale mucosa  Mouth:  no erythema or exudate, some posterior pharyngeal mucus  Neck:  no lymphadenopathy   Lungs:  CTAB  Penis: no lesions or urethral discharge Lymph: no groin adenopathy        Assessment & Plan:

## 2010-11-17 NOTE — Assessment & Plan Note (Signed)
Testing for HIV, syphilis, hepatitis B, GC, CT, HIV today. Normal exam and no symptoms.

## 2010-11-17 NOTE — Assessment & Plan Note (Signed)
Refilled Flonase. Advised to avoid triggers. Follow up as needed.

## 2010-11-17 NOTE — Patient Instructions (Signed)
I will let you know your results.  I do not think that that was a wart. Follow up if you notice any new warts Use the Flonase as directed.   - Dr. Wallene Huh

## 2010-11-18 LAB — HEPATITIS B SURFACE ANTIGEN: Hepatitis B Surface Ag: NEGATIVE

## 2010-11-18 LAB — RPR

## 2010-11-18 LAB — HIV ANTIBODY (ROUTINE TESTING W REFLEX): HIV: NONREACTIVE

## 2010-11-19 LAB — GC/CHLAMYDIA PROBE AMP, GENITAL: Chlamydia, DNA Probe: NEGATIVE

## 2010-11-23 ENCOUNTER — Encounter: Payer: Self-pay | Admitting: Family Medicine

## 2011-04-14 ENCOUNTER — Ambulatory Visit (INDEPENDENT_AMBULATORY_CARE_PROVIDER_SITE_OTHER): Payer: Self-pay | Admitting: Family Medicine

## 2011-04-14 ENCOUNTER — Encounter: Payer: Self-pay | Admitting: Family Medicine

## 2011-04-14 ENCOUNTER — Ambulatory Visit (HOSPITAL_COMMUNITY)
Admission: RE | Admit: 2011-04-14 | Discharge: 2011-04-14 | Disposition: A | Discharge status: Home / Self Care | Payer: Self-pay | Source: Ambulatory Visit | Attending: Family Medicine | Admitting: Family Medicine

## 2011-04-14 DIAGNOSIS — Z23 Encounter for immunization: Secondary | ICD-10-CM

## 2011-04-14 DIAGNOSIS — Z13220 Encounter for screening for lipoid disorders: Secondary | ICD-10-CM | POA: Insufficient documentation

## 2011-04-14 DIAGNOSIS — M25539 Pain in unspecified wrist: Secondary | ICD-10-CM

## 2011-04-14 MED ORDER — CETIRIZINE HCL 10 MG PO TABS: 10.0000 mg | ORAL_TABLET | Freq: Every day | ORAL | Status: DC

## 2011-04-14 MED ORDER — FLUTICASONE PROPIONATE 50 MCG/ACT NA SUSP: 2.0000 | Freq: Two times a day (BID) | NASAL | Status: DC

## 2011-04-14 NOTE — Assessment & Plan Note (Signed)
Patient's right wrist has some dorsal pain on the ulnar side kind of over the TFCC as well as possible lunate bone. Patient does not have any tenderness of the distal ulnar bone likely not a fracture but will be getting an x-ray to look for any type of dislocation secondary to ligamentous injury. Patient told to proper lifting techniques and to avoid extreme dorsiflexion also to tape just proximal to the distal radius and ulna bones.
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
## 2011-04-14 NOTE — Assessment & Plan Note (Signed)
Patient has a strong family history of hyperlipidemia as well as hypertension and diabetes we will do screening direct LDL if normal will repeat in 5 years.

## 2011-04-14 NOTE — Progress Notes (Signed)
Addended by: Jone Baseman D on: 04/14/2011 03:02 PM   Modules accepted: Orders

## 2011-04-14 NOTE — Patient Instructions (Signed)
Next to meet you. We will get an x-ray of the her wrist I will hopefully call you by Monday. We will also get a level of your cholesterol. I will see you when I see you.

## 2011-04-25 ENCOUNTER — Telehealth: Payer: Self-pay | Admitting: Family Medicine

## 2011-04-25 NOTE — Telephone Encounter (Signed)
Called pt back and told him that x-rays were normal.  Discussed direct ldl being a little high but can improve with diet and exercise.

## 2011-04-25 NOTE — Telephone Encounter (Signed)
Would like results of test from last week

## 2012-07-01 ENCOUNTER — Encounter (HOSPITAL_COMMUNITY): Payer: Self-pay | Admitting: *Deleted

## 2012-07-01 ENCOUNTER — Emergency Department (HOSPITAL_COMMUNITY)
Admission: EM | Admit: 2012-07-01 | Discharge: 2012-07-01 | Disposition: A | Discharge status: Home / Self Care | Payer: Self-pay | Attending: Emergency Medicine | Admitting: Emergency Medicine

## 2012-07-01 DIAGNOSIS — S39011A Strain of muscle, fascia and tendon of abdomen, initial encounter: Secondary | ICD-10-CM

## 2012-07-01 DIAGNOSIS — Y939 Activity, unspecified: Secondary | ICD-10-CM | POA: Insufficient documentation

## 2012-07-01 DIAGNOSIS — Z87891 Personal history of nicotine dependence: Secondary | ICD-10-CM | POA: Insufficient documentation

## 2012-07-01 DIAGNOSIS — IMO0002 Reserved for concepts with insufficient information to code with codable children: Secondary | ICD-10-CM | POA: Insufficient documentation

## 2012-07-01 DIAGNOSIS — Y929 Unspecified place or not applicable: Secondary | ICD-10-CM | POA: Insufficient documentation

## 2012-07-01 DIAGNOSIS — X58XXXA Exposure to other specified factors, initial encounter: Secondary | ICD-10-CM | POA: Insufficient documentation

## 2012-07-01 DIAGNOSIS — R109 Unspecified abdominal pain: Secondary | ICD-10-CM

## 2012-07-01 DIAGNOSIS — R1031 Right lower quadrant pain: Secondary | ICD-10-CM | POA: Insufficient documentation

## 2012-07-01 LAB — CBC WITH DIFFERENTIAL/PLATELET
Basophils Absolute: 0.1 10*3/uL (ref 0.0–0.1)
Basophils Relative: 1 % (ref 0–1)
Eosinophils Relative: 5 % (ref 0–5)
HCT: 44 % (ref 39.0–52.0)
Hemoglobin: 15.1 g/dL (ref 13.0–17.0)
MCHC: 34.3 g/dL (ref 30.0–36.0)
MCV: 79.3 fL (ref 78.0–100.0)
Monocytes Absolute: 0.5 10*3/uL (ref 0.1–1.0)
Monocytes Relative: 9 % (ref 3–12)
RDW: 13.6 % (ref 11.5–15.5)

## 2012-07-01 LAB — URINE MICROSCOPIC-ADD ON

## 2012-07-01 LAB — URINALYSIS, ROUTINE W REFLEX MICROSCOPIC
Bilirubin Urine: NEGATIVE
Glucose, UA: NEGATIVE mg/dL
Ketones, ur: NEGATIVE mg/dL
Protein, ur: NEGATIVE mg/dL

## 2012-07-01 LAB — COMPREHENSIVE METABOLIC PANEL
AST: 30 U/L (ref 0–37)
Albumin: 3.8 g/dL (ref 3.5–5.2)
BUN: 19 mg/dL (ref 6–23)
CO2: 27 mEq/L (ref 19–32)
Calcium: 9.6 mg/dL (ref 8.4–10.5)
Creatinine, Ser: 1.35 mg/dL (ref 0.50–1.35)
GFR calc non Af Amer: 71 mL/min — ABNORMAL LOW (ref >90)

## 2012-07-01 MED ORDER — NAPROXEN 500 MG PO TABS: 500.0000 mg | ORAL_TABLET | Freq: Two times a day (BID) | ORAL | Status: DC

## 2012-07-01 NOTE — ED Notes (Signed)
Pt reports increased right lower abdominal pain with movement. Pt first noticed pain right after work three weeks ago. Pt denies n/v/d.

## 2012-07-01 NOTE — ED Notes (Signed)
The pt is c/o abd pain for 3 weeks no nv

## 2012-07-01 NOTE — ED Provider Notes (Signed)
History     CSN: [id number]  Arrival date & time 07/01/12  0028   First MD Initiated Contact with Patient 07/01/12 0134      Chief Complaint  Patient presents with  . Abdominal Pain    (Consider location/radiation/quality/duration/timing/severity/associated sxs/prior treatment) HPI Comments: 26 year old male with a history of no significant medical problems who has had several months of intermittent right lower quadrant pain which he states comes in waves. He has been relatively pain-free for the last month but 3 days ago started having pain in the right lower quadrant.  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  He denies any nausea vomiting fevers chills coughing diarrhea dysuria or any other complaints. He denies any pain in his testicles or scrotum and has no penile discharge. He has never had surgery on his abdomen. Currently his pain is mild  The history is provided by the patient and a friend.    Past Medical History  Diagnosis Date  . Allergy     History reviewed. No pertinent past surgical history.  Family History  Problem Relation Age of Onset  . Hypertension Mother   . Diabetes Father   . Hypertension Father   . Hyperlipidemia Father     History  Substance Use Topics  . Smoking status: Former Smoker -- 0.2 packs/day for 5 years    Types: Cigarettes    Quit date: 09/18/2010  . Smokeless tobacco: Never Used     Comment: was occasional smoker when he did smoke  . Alcohol Use: 0.5 oz/week    1 drink(s) per week      Review of Systems  All other systems reviewed and are negative.    Allergies  Shellfish allergy  Home Medications   Current Outpatient Rx  Name  Route  Sig  Dispense  Refill  . ACETAMINOPHEN 500 MG PO TABS   Oral   Take 1,000 mg by  mouth every 6 (six) hours as needed. For headache         . NAPROXEN SODIUM 220 MG PO TABS   Oral   Take 440 mg by mouth 2 (two) times daily as needed. For headache         . NAPROXEN 500 MG PO TABS   Oral   Take 1 tablet (500 mg total) by mouth 2 (two) times daily with a meal.   30 tablet   0     BP 125/72  Pulse 62  Temp 98 F (36.7 C) (Oral)  Resp 18  SpO2 98%  Physical Exam  Nursing note and vitals reviewed. Constitutional: He appears well-developed and well-nourished. No distress.  HENT:  Head: Normocephalic and atraumatic.  Mouth/Throat: Oropharynx is clear and moist. No oropharyngeal exudate.  Eyes: Conjunctivae normal and EOM are normal. Pupils are equal, round, and reactive to light. Right eye exhibits no discharge. Left eye exhibits no discharge. No scleral icterus.  Neck: Normal range of motion. Neck supple. No JVD present. No thyromegaly present.  Cardiovascular: Normal rate, regular rhythm, normal heart sounds and intact distal pulses.  Exam reveals no gallop and no friction rub.   No murmur heard. Pulmonary/Chest: Effort normal and breath sounds normal. No respiratory distress. He has no wheezes. He has no rales.  Abdominal:  Soft. Bowel sounds are normal. He exhibits no distension and no mass. There is tenderness ( Mild tenderness in the right lower quadrant and mid abdomen, positive car netts sign, no Murphy sign, no guarding, no left lower quadrant pain).       No peritoneal signs, no masses, no hepatosplenomegaly  Genitourinary:       No hernias palpated in the inguinal region, normal scrotum, normal testicles, no penial discharge  Musculoskeletal: Normal range of motion. He exhibits no edema and no tenderness.  Lymphadenopathy:    He has no cervical adenopathy.  Neurological: He is alert. Coordination normal.  Skin: Skin is warm and dry. No rash noted. No erythema.  Psychiatric: He has a normal mood and affect. His behavior is normal.    ED Course   Procedures (including critical care time)  Labs Reviewed  URINALYSIS, ROUTINE W REFLEX MICROSCOPIC - Abnormal; Notable for the following:    Hgb urine dipstick TRACE (*)     All other components within normal limits  COMPREHENSIVE METABOLIC PANEL - Abnormal; Notable for the following:    Glucose, Bld 102 (*)     GFR calc non Af Amer 71 (*)     GFR calc Af Amer 83 (*)     All other components within normal limits  CBC WITH DIFFERENTIAL  URINE MICROSCOPIC-ADD ON   No results found.   1. Abdominal pain   2. Abdominal muscle strain       MDM  The patient is well-appearing with normal vital signs, normal white blood cell count, no signs of urinary tract infection or hernias. I suspect he has an abdominal wall strain, despite his pain being in the right lower quadrant I doubt this is appendicitis given his history of heavy lifting in the intermittent pain over the last several months. I can reproduce this by having him to sit up, he is relatively pain-free when he is lying flat and resting. He will be discharged with anti-inflammatories and followup as needed.  Pt declines pain meds here  Naprosyn         Vida Roller, MD 07/01/12 [id number]

## 2015-11-20 ENCOUNTER — Ambulatory Visit (HOSPITAL_COMMUNITY)
Admission: EM | Admit: 2015-11-20 | Discharge: 2015-11-20 | Disposition: A | Discharge status: Home / Self Care | Payer: Worker's Compensation | Attending: Family Medicine | Admitting: Family Medicine

## 2015-11-20 ENCOUNTER — Encounter (HOSPITAL_COMMUNITY): Payer: Self-pay

## 2015-11-20 ENCOUNTER — Ambulatory Visit (INDEPENDENT_AMBULATORY_CARE_PROVIDER_SITE_OTHER): Payer: Worker's Compensation

## 2015-11-20 DIAGNOSIS — S6991XA Unspecified injury of right wrist, hand and finger(s), initial encounter: Secondary | ICD-10-CM

## 2015-11-20 MED ORDER — NAPROXEN 500 MG PO TABS: 500.0000 mg | ORAL_TABLET | Freq: Two times a day (BID) | ORAL | Status: DC

## 2015-11-20 NOTE — Discharge Instructions (Signed)
It is a pleasure to see you today.  The x-ray of your right hand shows no fractures.   I recommend rest, use of anti-inflammatory medicine such as NAPROXEN 500mg  take 1 tablet by mouth twice daily with food.   Follow up if pain worsens or other problems arise.

## 2015-11-20 NOTE — ED Notes (Signed)
Patient presents with swollen right hand he states that boxes fell on his hand on Wednesday 11/18/2015. No acute distress

## 2015-11-20 NOTE — ED Provider Notes (Addendum)
CSN: [id number]     Arrival date & time 11/20/15  1820 History   First MD Initiated Contact with Patient 11/20/15 1933     No chief complaint on file.  (Consider location/radiation/quality/duration/timing/severity/associated sxs/prior Treatment) The history is provided by the patient. No language interpreter was used.   Patient presents with complaint of pain in R hand, most prominent in thenar/hypothenar area.  Began when boxes fell out of truck that he was unloading, fell onto patient's R hand as he tried to stabilize the boxes.  Has experienced swelling since that time.  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    Has been applying ice to the R hand. No other medications or interventions.  Mild improvement with the ice.   No prior injuries of the R hand.    NKDA>  Has shellfish allergy.  Past Medical History  Diagnosis Date  . Allergy    No past surgical history on file. Family History  Problem Relation Age of Onset  . Hypertension Mother   . Diabetes Father   . Hypertension Father   . Hyperlipidemia Father    Social History  Substance Use Topics  . Smoking status: Former Smoker -- 0.25 packs/day for 5 years    Types: Cigarettes    Quit date: 09/18/2010  . Smokeless tobacco: Never Used     Comment: was occasional smoker when he did smoke  . Alcohol Use: 0.5 oz/week    1 drink(s) per week    Review of Systems  Constitutional: Negative for fever, chills, diaphoresis and fatigue.  Neurological: Negative for tremors, weakness and numbness.    Allergies  Shellfish allergy  Home Medications   Prior to Admission medications   Medication Sig Start Date End Date Taking? Authorizing Provider  acetaminophen (TYLENOL) 500 MG tablet Take 1,000 mg by mouth every 6 (six) hours as needed. For headache    Historical Provider, MD  naproxen (NAPROSYN) 500 MG tablet Take 1 tablet (500 mg total) by mouth 2 (two) times  daily with a meal. 07/01/12   Eber Hong, MD  naproxen sodium (ALEVE) 220 MG tablet Take 440 mg by mouth 2 (two) times daily as needed. For headache    Historical Provider, MD   Meds Ordered and Administered this Visit  Medications - No data to display  BP 150/98 mmHg  Pulse 65  Temp(Src) 98.7 F (37.1 C) (Oral)  Resp 12  SpO2 100% No data found.   Physical Exam  Constitutional: He appears well-developed and well-nourished.  Neck: Neck supple.  Musculoskeletal:  RIGHT HAND with mild generalized swelling, no ecchymosis and no erythema.   Radial and ulnar pulses present in R wrist.   No point tenderness over R anatomic snuff box.   R wrist full active ROM.    Handgrip full, weaker than L handgrip.   Sensation in distal aspects of R fingers intact, brisk cap refill (< 2 seconds) in fingers of R hand.   No ecchymosis or point tenderness over metacarpals, MCP joints, or distal phalanges of R hand.   Neurological:  Sensation in distal fingertips of R hand intact.  Brisk cap refill <2 seconds in R fingers.   Able to resist with thumb flexion; handgrip complete albeit weaker than L handgrip.     ED Course  Procedures (including critical care time)  Labs Review Labs Reviewed - No data to display  Imaging Review No results found.  Visual Acuity Review  Right Eye Distance:   Left Eye Distance:   Bilateral Distance:    Right Eye Near:   Left Eye Near:    Bilateral Near:         MDM   1. Hand injury, right, initial encounter    Hand [facility] and [facility] reviewed personally by me during this visit, no fracture. Recommended rest, NSAID (Naproxen), may use otc wrist splint intermittently at home. Not scheduled to work for 2 more days.  Re-assessment if pain/swelling continue.     Barbaraann BarthelJames O Margaretann Abate, MD 11/20/15 1944  Barbaraann BarthelJames O Almus Woodham, MD 11/20/15 2020

## 2015-11-24 ENCOUNTER — Encounter (HOSPITAL_COMMUNITY): Payer: Self-pay | Admitting: Emergency Medicine

## 2015-11-24 ENCOUNTER — Ambulatory Visit (HOSPITAL_COMMUNITY)
Admission: EM | Admit: 2015-11-24 | Discharge: 2015-11-24 | Disposition: A | Discharge status: Home / Self Care | Payer: Worker's Compensation | Attending: Family Medicine | Admitting: Family Medicine

## 2015-11-24 DIAGNOSIS — S60221A Contusion of right hand, initial encounter: Secondary | ICD-10-CM | POA: Diagnosis not present

## 2015-11-24 NOTE — ED Notes (Signed)
Right hand pain, onset 11/18/15.  Reports pain and swelling in palm of hand

## 2015-11-24 NOTE — ED Provider Notes (Signed)
CSN: [id number]     Arrival date & time 11/24/15  1756 History   First MD Initiated Contact with Patient 11/24/15 1913     No chief complaint on file.  (Consider location/radiation/quality/duration/timing/severity/associated sxs/prior Treatment) HPI Comments: 30 year old male states that some boxes fell onto his right hand approximately 2-3 days prior to April 28. He was then told by his supervisor to see a medical provider for evaluation of his hand. He was seen at this urgent care on April 28. Examination showed some swelling to the thenar eminence and mild tenderness. The physical exam for function and circulation were normal. X-ray of the hand was negative.He was told to limit use of the right hand for a few days and apply ice and allow it to heal. Hat visit was reviewed as well as the x-rays. He presents today with complaints of persistent mild pain to the right hand and digits. Overall it is better. The swelling has improved somewhat but he continues to limit use of the hand and has received some criticism from coworkers at work.   Past Medical History  Diagnosis Date  . Allergy    No past surgical history on file. Family History  Problem Relation Age of Onset  . Hypertension Mother   . Diabetes Father   . Hypertension Father   . Hyperlipidemia Father    Social History  Substance Use Topics  . Smoking status: Former Smoker -- 0.25 packs/day for 5 years    Types: Cigarettes    Quit date: 09/18/2010  . Smokeless tobacco: Never Used     Comment: was occasional smoker when he did smoke  . Alcohol Use: 0.5 oz/week    1 drink(s) per week    Review of Systems  Constitutional: Negative.   Respiratory: Negative.   Musculoskeletal: Negative for back pain, joint swelling and neck pain.       As per history of present illness  Skin: Negative.  Negative for wound.  Neurological: Negative.   All other systems reviewed and are negative.   Allergies  Shellfish allergy  Home  Medications   Prior to Admission medications   Medication Sig Start Date End Date Taking? Authorizing Provider  acetaminophen (TYLENOL) 500 MG tablet Take 1,000 mg by mouth every 6 (six) hours as needed. For headache    Historical Provider, MD  naproxen (NAPROSYN) 500 MG tablet Take 1 tablet (500 mg total) by mouth 2 (two) times daily with a meal. 11/20/15   Barbaraann BarthelJames O Breen, MD  naproxen sodium (ALEVE) 220 MG tablet Take 440 mg by mouth 2 (two) times daily as needed. For headache    Historical Provider, MD   Meds Ordered and Administered this Visit  Medications - No data to display  BP 141/92 mmHg  Pulse 69  Temp(Src) 98.3 F (36.8 C) (Oral)  Resp 16  SpO2 96% No data found.   Physical Exam  Constitutional: He is oriented to person, place, and time. He appears well-developed and well-nourished. No distress.  Eyes: EOM are normal.  Neck: Normal range of motion. Neck supple.  Cardiovascular: Normal rate.   Pulmonary/Chest: Effort normal. No respiratory distress.  Musculoskeletal: He exhibits no edema.  Right hand with no apparent swelling. Normal function. Capillary refill is brisk. Normal warmth and color. Range of motion of the digits normal. Strength is 5 over 5. Able to oppose the thumb and create a fist. Mild tenderness over the thenar eminence. No swelling or tenderness to the wrist.  Neurological: He is  alert and oriented to person, place, and time. He exhibits normal muscle tone.  Skin: Skin is warm and dry.  Psychiatric: He has a normal mood and affect.  Nursing note and vitals reviewed.   ED Course  Procedures (including critical care time)  Labs Review Labs Reviewed - No data to display  Imaging Review No results found.   Visual Acuity Review  Right Eye Distance:   Left Eye Distance:   Bilateral Distance:    Right Eye Near:   Left Eye Near:    Bilateral Near:         MDM   1. Hand contusion, right, initial encounter    ACE to hand Wear wrap for a  few days as needed COntinue ice for a couple of more days Limit use for 2-4 days as needed. Elevate.     Hayden Rasmussen, NP 11/24/15 1955

## 2015-11-24 NOTE — Discharge Instructions (Signed)
Hand Contusion Wear wrap for a few days as needed COntinue ice for a couple of more days Limit use for 2-4 days as needed. A hand contusion is a deep bruise on your hand area. Contusions are the result of an injury that caused bleeding under the skin. The contusion may turn blue, purple, or yellow. Minor injuries will give you a painless contusion, but more severe contusions may stay painful and swollen for a few weeks. CAUSES  A contusion is usually caused by a blow, trauma, or direct force to an area of the body. SYMPTOMS   Swelling and redness of the injured area.  Discoloration of the injured area.  Tenderness and soreness of the injured area.  Pain. DIAGNOSIS  The diagnosis can be made by taking a history and performing a physical exam. An X-ray, CT scan, or MRI may be needed to determine if there were any associated injuries, such as broken bones (fractures). TREATMENT  Often, the best treatment for a hand contusion is resting, elevating, icing, and applying cold compresses to the injured area. Over-the-counter medicines may also be recommended for pain control. HOME CARE INSTRUCTIONS   Put ice on the injured area.  Put ice in a plastic bag.  Place a towel between your skin and the bag.  Leave the ice on for 15-20 minutes, 03-04 times a day.  Only take over-the-counter or prescription medicines as directed by your caregiver. Your caregiver may recommend avoiding anti-inflammatory medicines (aspirin, ibuprofen, and naproxen) for 48 hours because these medicines may increase bruising.  If told, use an elastic wrap as directed. This can help reduce swelling. You may remove the wrap for sleeping, showering, and bathing. If your fingers become numb, cold, or blue, take the wrap off and reapply it more loosely.  Elevate your hand with pillows to reduce swelling.  Avoid overusing your hand if it is painful. SEEK IMMEDIATE MEDICAL CARE IF:   You have increased redness, swelling,  or pain in your hand.  Your swelling or pain is not relieved with medicines.  You have loss of feeling in your hand or are unable to move your fingers.  Your hand turns cold or blue.  You have pain when you move your fingers.  Your hand becomes warm to the touch.  Your contusion does not improve in 2 days. MAKE SURE YOU:   Understand these instructions.  Will watch your condition.  Will get help right away if you are not doing well or get worse.   This information is not intended to replace advice given to you by your health care provider. Make sure you discuss any questions you have with your health care provider.   Document Released: 12/31/2001 Document Revised: 04/04/2012 Document Reviewed: 01/02/2012 Elsevier Interactive Patient Education [employer]2016 Elsevier Inc.

## 2015-12-02 DIAGNOSIS — Z87891 Personal history of nicotine dependence: Secondary | ICD-10-CM | POA: Diagnosis not present

## 2015-12-02 DIAGNOSIS — W208XXD Other cause of strike by thrown, projected or falling object, subsequent encounter: Secondary | ICD-10-CM | POA: Diagnosis not present

## 2015-12-02 DIAGNOSIS — Z791 Long term (current) use of non-steroidal anti-inflammatories (NSAID): Secondary | ICD-10-CM | POA: Diagnosis not present

## 2015-12-02 DIAGNOSIS — S60221D Contusion of right hand, subsequent encounter: Secondary | ICD-10-CM | POA: Insufficient documentation

## 2015-12-02 DIAGNOSIS — S6991XD Unspecified injury of right wrist, hand and finger(s), subsequent encounter: Secondary | ICD-10-CM | POA: Diagnosis present

## 2015-12-03 ENCOUNTER — Emergency Department (HOSPITAL_COMMUNITY): Payer: Worker's Compensation

## 2015-12-03 ENCOUNTER — Emergency Department (HOSPITAL_COMMUNITY)
Admission: EM | Admit: 2015-12-03 | Discharge: 2015-12-03 | Disposition: A | Discharge status: Home / Self Care | Payer: Worker's Compensation | Attending: Emergency Medicine | Admitting: Emergency Medicine

## 2015-12-03 ENCOUNTER — Encounter (HOSPITAL_COMMUNITY): Payer: Self-pay | Admitting: Emergency Medicine

## 2015-12-03 DIAGNOSIS — S60221D Contusion of right hand, subsequent encounter: Secondary | ICD-10-CM

## 2015-12-03 NOTE — Discharge Instructions (Signed)
Rest, Ice intermittently (in the first 24-48 hours), Gentle compression with an Ace wrap, and elevate (Limb above the level of the heart)   Take up to 800mg  of ibuprofen (that is usually 4 over the counter pills)  3 times a day for 5 days. Take with food.  Do not hesitate to return to the emergency room for any new, worsening or concerning symptoms.  Please obtain primary care using resource guide below. Let them know that you were seen in the emergency room and that they will need to obtain records for further outpatient management. [employer]Community Resource Guide Financial Assistance The United Ways 211 is a great source of information about community services available.  Access by dialing 2-1-1 from anywhere in [place], or by website -  PooledIncome.plwww.nc211.org.   Other Local Resources (Updated 07/2015)  Financial Assistance   Services    Phone Number and Address  [facility]  Low-cost medical care - 1st and 3rd Saturday of every month  Must not qualify for public or private insurance and must have limited income [phone] [address] [place], [place]    [place] Department of Social Services  Child care  Emergency assistance for housing and Kimberly-Clarkutilities  Food stamps  Medicaid [phone] [address] [place], [place] [address]   [facility]  Low-cost medical care for children, communicable diseases, sexually-transmitted diseases, immunizations, maternity care, womens health and family planning [phone] [address] [place], [place] [address]  [facility]   Medication assistance for [place] [facility] residents  Must meet income requirements [phone] [address] [place], [place].    [facility]  Child care  Emergency assistance for housing and Kimberly-Clarkutilities  Food stamps  Medicaid [phone] [address] [place], [place]  [id number]  [facility]   Low-cost medical care,   Monday through Friday, 9 am to 6 pm.   Accepts Medicare/Medicaid, and self-pay [phone] [address] [place], [place] [address]  [facility]  Low-cost medical care - Monday through Friday, 8:30 am - 5:30 pm  Accepts Medicaid and self-pay [phone] [address] [place], [place] [address]   [facility]  Primary medical care, including for those with sickle cell disease  Accepts Medicare, Medicaid, insurance and self-pay [phone] [address] [place], [place]  [facility]   Primary medical care  Accepts Medicare, [place], insurance and self-pay [phone] [address] [place], [place] [address]   [facility] Department of Social Services  Child care  Emergency assistance for housing and Kimberly-Clarkutilities  Food stamps  Medicaid [phone] [address] [place], [place] [address]  [facility] Department of Health and [employer]Human Services  Child care  Emergency assistance for housing and Kimberly-Clarkutilities  Food stamps  Medicaid [phone] [address] [place], [place] [address]   [facility] Medication Assistance Program  Medication assistance for [facility] [place] residents with no insurance only  Must have a primary care doctor [phone] [address] [place], [place]  [facility]   Primary medical care  StepneyAccepts Medicare, [place]icaid, insurance  [phone] [address] [place], [place]  [facility]ist   Medication assistance [phone]  [facility]   Primary medical care  Accepts Medicare, [place], insurance and self-pay [phone] [place], [place] [address]  [facility]  Primary medical care  Accepts Medicare, Medicaid, insurance and self-pay [phone] [address]  [place], [place] [address]  [facility]  For [place] residents between the ages of 39 and 54 who do not have any form of health insurance, Medicare, [place], or [place] benefits.  Services are provided free of charge to uninsured patients who fall within federal poverty guidelines.    Hours: Tuesdays and Thursdays, 4:15 - 8 pm [phone] [address] [place], [place] [address]  [facility]     Primary medical care  Dental care  Nutritional counseling  Pharmacy  Accepts Medicaid, Medicare, most insurance.  Fees are adjusted based on ability to pay.   [phone] [facility] [address] [place], [place]  [phone] [facility] [address] [place], [place]  [phone] [facility] [place], [place]  [phone] [facility], [address] [place], [place]  [phone] [facility] [address] [place], [place]  [facility]  Womens health and family planning [phone] [facility] [place], [place]  [facility] Department of Social Services  Child care  Emergency assistance for housing and [employer]  Medicaid [phone] [address] [place], [place] [address]   [facility]    Ages 105 and older  Hours: Mondays and Thursdays, 7:00 am - 9:00 am Patients are seen on a first come, first served basis. [phone], [phone] [place], [place]  [facility] Division of Social Services  Child care  Emergency assistance for housing and [employer]  Medicaid [id number] [address] [place], [place] [address]  [facility]  Medication assistance  Rental assistance  Food pantry  Medication assistance  Housing assistance  Emergency food distribution  Utility assistance [phone] [address] [place], [place]  [phone]  [address] [place], [place] [address] Hours: Tuesdays and Thursdays from 9am - 12 noon by appointment only  [phone] [address] [place], [place] [address]  [facility]   Accepts private insurance, [place], and [place].  Payment is based on a sliding scale for those without insurance.  Hours: Mondays, Tuesdays and Thursdays, 8:30 am - 5:30 pm.   [phone] [address] [place], [place]  [facility] - Family Medicine at [facility] [place], [place], and [place].  Payment is based on a sliding scale for those without insurance. [phone] [address] [place], [place]  [facility] - Pediatrics at E. Scientist, research (physical sciences), [employer], and [place].  Payment is based on a sliding scale for those without insurance [phone] [address], [employer], [place]  [facility] - Pediatrics at [employer], [place], and [place].  Payment is based on a sliding scale for those without insurance. [phone] [address] [place], [place]  [facility] - Pediatrics at [facility], [place], and [place].  Payment is based on a sliding scale for those without insurance. [phone], [phone] [place], [place].    [facility]  Maternity care.  Accepts Medicaid and self-pay. [phone] [address] [place], [place]

## 2015-12-03 NOTE — ED Provider Notes (Signed)
CSN: [id number]     Arrival date & time 12/02/15  2341 History   None    Chief Complaint  Patient presents with  . Hand Injury     (Consider location/radiation/quality/duration/timing/severity/associated sxs/prior Treatment) HPI  Blood pressure 138/86, pulse 60, temperature 98.4 F (36.9 C), temperature source Oral, resp. rate 20, height  (1.702 m), weight 96.786 kg, SpO2 100 %.  Colton Flores is a 30 y.o. male complaining of persistent pain to right hand on the dorsal aspects of the second metacarpal. Patient had some boxes fall onto the hand as he was reaching out to stabilize them several weeks ago. X-ray was negative, this is his third visit for similar, patient states the pain is 4 out of 10, no pain medication taken prior to arrival, patient denies weakness, numbness, reduced range of motion. No exacerbating or alleviating factors identified.  Past Medical History  Diagnosis Date  . Allergy    History reviewed. No pertinent past surgical history. Family History  Problem Relation Age of Onset  . Hypertension Mother   . Diabetes Father   . Hypertension Father   . Hyperlipidemia Father    Social History  Substance Use Topics  . Smoking status: Former Smoker -- 0.25 packs/day for 0 years    Types: Cigarettes    Quit date: 09/18/2010  . Smokeless tobacco: Never Used  . Alcohol Use: Yes    Review of Systems  10 systems reviewed and found to be negative, except as noted in the HPI.   Allergies  Shellfish allergy  Home Medications   Prior to Admission medications   Medication Sig Start Date End Date Taking? Authorizing Provider  acetaminophen (TYLENOL) 500 MG tablet Take 1,000 mg by mouth every 6 (six) hours as needed. For headache    Historical Provider, MD  naproxen (NAPROSYN) 500 MG tablet Take 1 tablet (500 mg total) by mouth 2 (two) times daily with a meal. 11/20/15   Barbaraann Barthel, MD  naproxen sodium (ALEVE) 220 MG tablet Take 440 mg by mouth 2 (two)  times daily as needed. For headache    Historical Provider, MD   BP 138/86 mmHg  Pulse 60  Temp(Src) 98.4 F (36.9 C) (Oral)  Resp 20  Ht  (1.702 m)  Wt 96.786 kg  BMI 33.41 kg/m2  SpO2 100% Physical Exam  Constitutional: He is oriented to person, place, and time. He appears well-developed and well-nourished. No distress.  HENT:  Head: Normocephalic.  Eyes: Conjunctivae and EOM are normal.  Cardiovascular: Normal rate.   Pulmonary/Chest: Effort normal. No stridor.  Musculoskeletal: Normal range of motion. He exhibits no edema or tenderness.  Right hand with no deformity radial pulses 2+, cap refill is brisk 5, full range of motion to right first and second digit in flexion and extension of each interphalangeal and MCP joint tested in isolation.  Neurological: He is alert and oriented to person, place, and time.  Psychiatric: He has a normal mood and affect.  Nursing note and vitals reviewed.   ED Course  Procedures (including critical care time) Labs Review Labs Reviewed - No data to display  Imaging Review No results found. I have personally reviewed and evaluated these images and lab results as part of my medical decision-making.   EKG Interpretation None      MDM   Final diagnoses:  Hand contusion, right, subsequent encounter    Filed Vitals:   12/02/15 2352  BP: 138/86  Pulse: 60  Temp:  98.4 F (36.9 C)  TempSrc: Oral  Resp: 20  Height: 5\' 7"  (1.702 m)  Weight: 96.786 kg  SpO2: 100%   Colton Flores is 30 y.o. male presenting with Right hand discomfort after he had a contusion several weeks ago. Physical exam with no abnormality, x-ray does not show any fractures. Given his persistent pain patient is given hand surgery follow-up.  Evaluation does not show pathology that would require ongoing emergent intervention or inpatient treatment. Pt is hemodynamically stable and mentating appropriately. Discussed findings and plan with patient/guardian, who  agrees with care plan. All questions answered. Return precautions discussed and outpatient follow up given.      Joni Reiningicole Luby Seamans, PA-C 12/03/15 [id number]  Tomasita CrumbleAdeleke Oni, MD 12/03/15 1049

## 2015-12-03 NOTE — ED Notes (Signed)
Pt. reported that a box fell on his right hand at work  last 11/18/15 states pain and mild swelling .

## 2016-01-07 ENCOUNTER — Telehealth: Payer: Self-pay

## 2016-01-07 NOTE — Telephone Encounter (Signed)
Spoke with patient's mom regarding shot record.  Patient is going to college.  Advised her that patient has not been seen in clinic since 2012 and will be considered a new patient.  There is a wait list for new patient. Give her [facility] phone number to call for vaccine information.  Shot record placed up front for pick up.  Clovis PuMartin, Tamika L, RN

## 2016-01-07 NOTE — Telephone Encounter (Signed)
Last seen in 2012. Needs shot record printed. Please call [phone] for pick up.

## 2016-03-03 ENCOUNTER — Encounter (HOSPITAL_COMMUNITY): Payer: Self-pay | Admitting: Emergency Medicine

## 2016-03-03 ENCOUNTER — Emergency Department (HOSPITAL_COMMUNITY): Payer: 59

## 2016-03-03 ENCOUNTER — Emergency Department (HOSPITAL_COMMUNITY)
Admission: EM | Admit: 2016-03-03 | Discharge: 2016-03-04 | Disposition: A | Discharge status: Home / Self Care | Payer: 59 | Attending: Emergency Medicine | Admitting: Emergency Medicine

## 2016-03-03 DIAGNOSIS — Y999 Unspecified external cause status: Secondary | ICD-10-CM | POA: Diagnosis not present

## 2016-03-03 DIAGNOSIS — Y9361 Activity, american tackle football: Secondary | ICD-10-CM | POA: Insufficient documentation

## 2016-03-03 DIAGNOSIS — X509XXA Other and unspecified overexertion or strenuous movements or postures, initial encounter: Secondary | ICD-10-CM | POA: Diagnosis not present

## 2016-03-03 DIAGNOSIS — M25562 Pain in left knee: Secondary | ICD-10-CM | POA: Diagnosis present

## 2016-03-03 DIAGNOSIS — S86812A Strain of other muscle(s) and tendon(s) at lower leg level, left leg, initial encounter: Secondary | ICD-10-CM | POA: Diagnosis not present

## 2016-03-03 DIAGNOSIS — Y929 Unspecified place or not applicable: Secondary | ICD-10-CM | POA: Diagnosis not present

## 2016-03-03 DIAGNOSIS — Z87891 Personal history of nicotine dependence: Secondary | ICD-10-CM | POA: Insufficient documentation

## 2016-03-03 LAB — CBC WITH DIFFERENTIAL/PLATELET
BASOS ABS: 0.1 10*3/uL (ref 0.0–0.1)
Basophils Relative: 1 %
EOS PCT: 2 %
Eosinophils Absolute: 0.2 10*3/uL (ref 0.0–0.7)
HCT: 48.6 % (ref 39.0–52.0)
Hemoglobin: 16.2 g/dL (ref 13.0–17.0)
LYMPHS ABS: 1.2 10*3/uL (ref 0.7–4.0)
LYMPHS PCT: 18 %
MCH: 26.3 pg (ref 26.0–34.0)
MCHC: 33.3 g/dL (ref 30.0–36.0)
MCV: 79 fL (ref 78.0–100.0)
MONO ABS: 0.6 10*3/uL (ref 0.1–1.0)
Monocytes Relative: 8 %
Neutro Abs: 4.9 10*3/uL (ref 1.7–7.7)
Neutrophils Relative %: 71 %
Platelets: 260 10*3/uL (ref 150–400)
RBC: 6.15 MIL/uL — ABNORMAL HIGH (ref 4.22–5.81)
RDW: 13.5 % (ref 11.5–15.5)
WBC: 6.9 10*3/uL (ref 4.0–10.5)

## 2016-03-03 LAB — BASIC METABOLIC PANEL
Anion gap: 7 (ref 5–15)
BUN: 15 mg/dL (ref 6–20)
CALCIUM: 9.3 mg/dL (ref 8.9–10.3)
CHLORIDE: 108 mmol/L (ref 101–111)
CO2: 24 mmol/L (ref 22–32)
CREATININE: 1.36 mg/dL — AB (ref 0.61–1.24)
GFR calc non Af Amer: >60 mL/min (ref >60)
Glucose, Bld: 117 mg/dL — ABNORMAL HIGH (ref 65–99)
Potassium: 4.1 mmol/L (ref 3.5–5.1)
SODIUM: 139 mmol/L (ref 135–145)

## 2016-03-03 NOTE — ED Triage Notes (Signed)
Pt. reports left knee pain / swelling injured today while running/playing football . Pain increases with movement.

## 2016-03-03 NOTE — ED Provider Notes (Signed)
[facility] Provider Note   CSN: [id number] Arrival date & time: 03/03/16  2015  First Provider Contact:   First MD Initiated Contact with Patient 03/03/16 2353      By signing my name below, I, Christy Sartorius, attest that this documentation has been prepared under the direction and in the presence of  Felicie Morn, NP. Electronically Signed: Christy Sartorius, ED Scribe. 03/03/16. 11:47 PM.   History   Chief Complaint Chief Complaint  Patient presents with  . Knee Pain    The history is provided by the patient. No language interpreter was used.  Knee Pain   This is a new problem. The current episode started 3 to 5 hours ago. The problem occurs constantly. The problem has not changed since onset.The pain is present in the left knee. The quality of the pain is described as aching. The pain is moderate. Pertinent negatives include no numbness and no tingling. The symptoms are aggravated by activity, standing and contact. He has tried nothing for the symptoms. The treatment provided no relief. There has been no history of extremity trauma.    HPI Comments:  Colton Flores is a 30 y.o. male who presents to the Emergency Department complaining of moderate left knee pain and swelling s/p injury that occurred earlier today. Pt states he was running while playing football, when he felt suddenly felt pain, characterized as though he had been kicked in the knee. He reports there was no direct contact or trauma to his knee. Pt is unable to bear weight or fully extend the knee at this time.  Pt is able to bend his knee slightly, but it causes pain. He has not tried ice or pain medication PTA to alleviate symptoms. Pt has no history of prior knee injury. No h/o kidney disease, steroid use. Pt denies numbness, tingling, additional injury.   Past Medical History:  Diagnosis Date  . Allergy     Patient Active Problem List   Diagnosis Date Noted  . Wrist pain 04/14/2011  . Screening  cholesterol level 04/14/2011  . Exposure to viral disease 11/17/2010  . SINUSITIS 06/29/2010  . 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 No pertinent surgical history.     Home Medications    Prior to Admission medications   Medication Sig Start Date End Date Taking? Authorizing Provider  acetaminophen (TYLENOL) 500 MG tablet Take 1,000 mg by mouth every 6 (six) hours as needed. For headache    Historical Provider, MD  naproxen (NAPROSYN) 500 MG tablet Take 1 tablet (500 mg total) by mouth 2 (two) times daily with a meal. 11/20/15   Barbaraann Barthel, MD  naproxen sodium (ALEVE) 220 MG tablet Take 440 mg by mouth 2 (two) times daily as needed. For headache    Historical Provider, MD    Family History Family History  Problem Relation Age of Onset  . Hypertension Mother   . Diabetes Father   . Hypertension Father   . Hyperlipidemia Father     Social History Social History  Substance Use Topics  . Smoking status: Former Smoker    Types: Cigarettes  . Smokeless tobacco: Never Used  . Alcohol use Yes     Allergies   Shellfish allergy   Review of Systems Review of Systems  Musculoskeletal: Positive for arthralgias (left knee) and myalgias (left knee).  Neurological: Negative for tingling and numbness.  All other systems reviewed and are negative.    Physical Exam Updated Vital Signs BP  133/92 (BP Location: Right Arm)   Pulse 87   Temp 99.1 F (37.3 C) (Oral)   Resp 18   SpO2 99%   Physical Exam  Constitutional: He appears well-developed and well-nourished.  HENT:  Head: Normocephalic.  Eyes: Conjunctivae are normal.  Cardiovascular: Normal rate, regular rhythm and normal heart sounds.   Pulmonary/Chest: Effort normal and breath sounds normal. No respiratory distress. He has no wheezes.  Abdominal: He exhibits no distension.  Musculoskeletal: Normal range of motion. He exhibits tenderness.  Tenderness of the left patella with proximal swelling     Neurological: He is alert.  Skin: Skin is warm and dry.  Psychiatric: He has a normal mood and affect. His behavior is normal.  Nursing note and vitals reviewed.    ED Treatments / Results  Labs (all labs ordered are listed, but only abnormal results are displayed) Labs Reviewed  CBC WITH DIFFERENTIAL/PLATELET - Abnormal; Notable for the following:       Result Value   RBC 6.15 (*)    All other components within normal limits  BASIC METABOLIC PANEL - Abnormal; Notable for the following:    Glucose, Bld 117 (*)    Creatinine, Ser 1.36 (*)    All other components within normal limits    EKG  EKG Interpretation None       Radiology Dg Knee Complete 4 Views Left  Result Date: 03/03/2016 CLINICAL DATA:  Pt states his left knee "gave out" while playing football today, left knee pain and swelling. EXAM: LEFT KNEE - COMPLETE 4+ VIEW COMPARISON:  02/09/2004 FINDINGS: There is irregularity in the region of the patellar tendon. The patella is superiorly located. Small bone fragments are identified anterior to the femur, in the expected location of the patellar tendon. Joint effusion is present. Irregularity at the anterior tibial tubercle is new since the move prior study but could be chronic. Distal femur and proximal tibia/fibula are otherwise intact. IMPRESSION: Patella Alta and findings suspicious for patellar tendon rupture. Electronically Signed   By: Norva PavlovElizabeth  Brown M.D.   On: 03/03/2016 21:21    Procedures Procedures (including critical care time)  DIAGNOSTIC STUDIES:  Oxygen Saturation is 99% on RA, normal by my interpretation.    COORDINATION OF CARE:  11:58 PM Will splint knee and send pt for orthopedic consult.  Discussed treatment plan with pt at bedside and pt agreed to plan.   Medications Ordered in ED Medications  HYDROcodone-acetaminophen (NORCO/VICODIN) 5-325 MG per tablet 1 tablet (1 tablet Oral Given 03/04/16 0012)     Initial Impression / Assessment and  Plan / ED Course  I have reviewed the triage vital signs and the nursing notes.  Pertinent labs & imaging results that were available during my care of the patient were reviewed by me and considered in my medical decision making (see chart for details).  Clinical Course   Patient X-Ray shows Patella Alta and findings suspicious for patellar tendon rupture.  Pt advised to follow up with orthopedics. Patient given splint and crutches while in ED, conservative therapy recommended and discussed. Patient will be discharged home & is agreeable with above plan. Returns precautions discussed. Pt appears safe for discharge.   Final Clinical Impressions(s) / ED Diagnoses   Final diagnoses:  Patellar tendon rupture, left, initial encounter    New Prescriptions New Prescriptions   No medications on file   I personally performed the services described in this documentation, which was scribed in my presence. The recorded information has been  reviewed and is accurate.     Felicie Morn, NP 03/04/16 1610    Alvira Monday, MD 03/04/16 [phone]

## 2016-03-04 MED ORDER — HYDROCODONE-ACETAMINOPHEN 5-325 MG PO TABS
1.0000 | ORAL_TABLET | Freq: Once | ORAL | Status: AC
  Administered 2016-03-04: 1 via ORAL
  Filled 2016-03-04: qty 1

## 2016-03-04 NOTE — ED Notes (Signed)
Ortho at bedside.

## 2016-03-04 NOTE — ED Notes (Signed)
Patient verbalizes understanding of discharge instructions. Patient to lobby in wheelchair to wait for ride. NAD noted.

## 2018-05-12 ENCOUNTER — Other Ambulatory Visit: Payer: Self-pay

## 2018-05-12 ENCOUNTER — Encounter (HOSPITAL_COMMUNITY): Payer: Self-pay | Admitting: Emergency Medicine

## 2018-05-12 ENCOUNTER — Ambulatory Visit (HOSPITAL_COMMUNITY)
Admission: EM | Admit: 2018-05-12 | Discharge: 2018-05-12 | Disposition: A | Discharge status: Home / Self Care | Payer: 59 | Attending: Internal Medicine | Admitting: Internal Medicine

## 2018-05-12 DIAGNOSIS — S0990XA Unspecified injury of head, initial encounter: Secondary | ICD-10-CM

## 2018-05-12 MED ORDER — IBUPROFEN 800 MG PO TABS: 800.0000 mg | ORAL_TABLET | Freq: Three times a day (TID) | ORAL | 0 refills | Status: DC

## 2018-05-12 NOTE — ED Triage Notes (Addendum)
Patient stood up and hit head on a pole.  This occurred today.  No loc.  Patient has a slight headache  Patient said something was bleeding in the area of the crown of his head.  Unable to see anything now.  Patient has thick braids

## 2018-05-12 NOTE — Discharge Instructions (Signed)
Use anti-inflammatories for pain/swelling. You may take up to 800 mg Ibuprofen every 8 hours with food. You may supplement Ibuprofen with Tylenol [phone] mg every 8 hours.   Abrasion on her head does not need stitches, this should heal on its own.  Please keep clean and dry.  If you start to have bleeding, please apply direct pressure, follow-up if bleeding not stopping in 15 minutes with continuous pressure.  Please go to emergency room if you have worsening headache, vision changes, nausea, vomiting, weakness

## 2018-05-13 NOTE — ED Provider Notes (Signed)
[facility]    CSN: [id number] Arrival date & time: 05/12/18  1149     History   Chief Complaint Chief Complaint  Patient presents with  . Head Injury    HPI Colton Flores is a 32 y.o. male no contributing past medical history presenting today for evaluation after head injury.  Patient states that earlier this morning around 8 AM he raised his head and hit a metal pole.  Denies loss of consciousness.  Was dizzy immediately after, but this resolved.  Denies associated nausea, vomiting.  He believes he obtained an abrasion to the top of his head as he had some bleeding.  Since he has had some headache.  He went to work at [employer] and had an episode of dizziness which is now resolved.  He denies any other symptoms besides a headache at this time.  Denies difficulty concentrating.  HPI  Past Medical History:  Diagnosis Date  . Allergy     Patient Active Problem List   Diagnosis Date Noted  . Wrist pain 04/14/2011  . Screening cholesterol level 04/14/2011  . Exposure to viral disease 11/17/2010  . SINUSITIS 06/29/2010  . 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 KNEE SURGERY         Home Medications    Prior to Admission medications   Medication Sig Start Date End Date Taking? Authorizing Provider  acetaminophen (TYLENOL) 500 MG tablet Take 1,000 mg by mouth every 6 (six) hours as needed. For headache    [provider]  ibuprofen (ADVIL,MOTRIN) 800 MG tablet Take 1 tablet (800 mg total) by mouth 3 (three) times daily. 05/12/18   Koby Hartfield C, PA-C  naproxen (NAPROSYN) 500 MG tablet Take 1 tablet (500 mg total) by mouth 2 (two) times daily with a meal. 11/20/15   Barbaraann Barthel, MD  naproxen sodium (ALEVE) 220 MG tablet Take 440 mg by mouth 2 (two) times daily as needed. For headache    [provider]    Family History Family History  Problem Relation Age of Onset  . Hypertension Mother    . Diabetes Father   . Hypertension Father   . Hyperlipidemia Father     Social History Social History   Tobacco Use  . Smoking status: Former Smoker    Types: Cigarettes  . Smokeless tobacco: Never Used  Substance Use Topics  . Alcohol use: Yes  . Drug use: No     Allergies   Shellfish allergy   Review of Systems Review of Systems  Constitutional: Negative for fatigue and fever.  HENT: Negative for congestion, sinus pressure and sore throat.   Eyes: Negative for photophobia, pain and visual disturbance.  Respiratory: Negative for cough and shortness of breath.   Cardiovascular: Negative for chest pain.  Gastrointestinal: Negative for abdominal pain, nausea and vomiting.  Genitourinary: Negative for decreased urine volume and hematuria.  Musculoskeletal: Negative for myalgias, neck pain and neck stiffness.  Skin: Positive for wound.  Neurological: Positive for headaches. Negative for dizziness, syncope, facial asymmetry, speech difficulty, weakness, light-headedness and numbness.     Physical Exam Triage Vital Signs ED Triage Vitals  Enc Vitals Group     BP 05/12/18 1238 127/71     Pulse Rate 05/12/18 1238 60     Resp 05/12/18 1238 18     Temp 05/12/18 1238 97.9 F (36.6 C)     Temp Source 05/12/18 1238 Oral  SpO2 05/12/18 1238 98 %     Weight --      Height --      Head Circumference --      Peak Flow --      Pain Score 05/12/18 1236 3     Pain Loc --      Pain Edu? --      Excl. in GC? --    No data found.  Updated Vital Signs BP 127/71 (BP Location: Left Arm)   Pulse 60   Temp 97.9 F (36.6 C) (Oral)   Resp 18   SpO2 98%   Visual Acuity Right Eye Distance:   Left Eye Distance:   Bilateral Distance:    Right Eye Near:   Left Eye Near:    Bilateral Near:     Physical Exam  Constitutional: He appears well-developed and well-nourished.  HENT:  Head: Normocephalic and atraumatic.  Mouth/Throat: Oropharynx is clear and moist.  Eyes:  Pupils are equal, round, and reactive to light. Conjunctivae and EOM are normal.  Neck: Neck supple.  Cardiovascular: Normal rate and regular rhythm.  No murmur heard. Pulmonary/Chest: Effort normal and breath sounds normal. No respiratory distress.  Abdominal: Soft. There is no tenderness.  Musculoskeletal: He exhibits no edema.  Neurological: He is alert.  Patient A&O x3, cranial nerves II-XII grossly intact, strength at shoulders, hips and knees 5/5, equal bilaterally, patellar reflex 1+ bilaterally. Normal Finger to nose, RAM and heel to shin. Negative Romberg and Pronator Drift. Gait without abnormality.  Skin: Skin is warm and dry.  Superficial abrasion, slightly oozing blood to crown of head, does not appear deep  Psychiatric: He has a normal mood and affect.  Nursing note and vitals reviewed.    UC Treatments / Results  Labs (all labs ordered are listed, but only abnormal results are displayed) Labs Reviewed - No data to display  EKG None  Radiology No results found.  Procedures Procedures (including critical care time)  Medications Ordered in UC Medications - No data to display  Initial Impression / Assessment and Plan / UC Course  I have reviewed the triage vital signs and the nursing notes.  Pertinent labs & imaging results that were available during my care of the patient were reviewed by me and considered in my medical decision making (see chart for details).     Patient with minor head injury, no neuro deficits, small abrasion to head, would expect gradual self resolution.  Bleeding controlled.  Minimal concussive symptoms.  At this time will treat headache with anti-inflammatories, Tylenol and ibuprofen.  Rest and continue to monitor his symptoms over the next 24 to 48 hours.  Patient does not appear to warrant more emergent evaluation at this time.  Discussed strict return precautions. Patient verbalized understanding and is agreeable with plan.  Final  Clinical Impressions(s) / UC Diagnoses   Final diagnoses:  Minor head injury, initial encounter     Discharge Instructions     Use anti-inflammatories for pain/swelling. You may take up to 800 mg Ibuprofen every 8 hours with food. You may supplement Ibuprofen with Tylenol [phone] mg every 8 hours.   Abrasion on her head does not need stitches, this should heal on its own.  Please keep clean and dry.  If you start to have bleeding, please apply direct pressure, follow-up if bleeding not stopping in 15 minutes with continuous pressure.  Please go to emergency room if you have worsening headache, vision changes, nausea, vomiting, weakness  ED Prescriptions    Medication Sig Dispense Auth. Provider   ibuprofen (ADVIL,MOTRIN) 800 MG tablet Take 1 tablet (800 mg total) by mouth 3 (three) times daily. 21 tablet Belissa Kooy, [place] C, PA-C     Controlled Substance Prescriptions [place] Controlled Substance Registry consulted? Not Applicable   Lew Dawes, [place] 05/13/18 1027

## 2018-08-01 ENCOUNTER — Emergency Department (HOSPITAL_COMMUNITY)
Admission: EM | Admit: 2018-08-01 | Discharge: 2018-08-02 | Disposition: A | Discharge status: Home / Self Care | Payer: Managed Care, Other (non HMO) | Attending: Emergency Medicine | Admitting: Emergency Medicine

## 2018-08-01 ENCOUNTER — Encounter (HOSPITAL_COMMUNITY): Payer: Self-pay

## 2018-08-01 DIAGNOSIS — A084 Viral intestinal infection, unspecified: Secondary | ICD-10-CM | POA: Insufficient documentation

## 2018-08-01 DIAGNOSIS — Z87891 Personal history of nicotine dependence: Secondary | ICD-10-CM | POA: Diagnosis not present

## 2018-08-01 DIAGNOSIS — R112 Nausea with vomiting, unspecified: Secondary | ICD-10-CM | POA: Diagnosis present

## 2018-08-01 DIAGNOSIS — N289 Disorder of kidney and ureter, unspecified: Secondary | ICD-10-CM | POA: Insufficient documentation

## 2018-08-01 DIAGNOSIS — K2970 Gastritis, unspecified, without bleeding: Secondary | ICD-10-CM

## 2018-08-01 MED ORDER — ONDANSETRON 4 MG PO TBDP
4.0000 mg | ORAL_TABLET | Freq: Once | ORAL | Status: AC | PRN
  Administered 2018-08-01: 4 mg via ORAL
  Filled 2018-08-01: qty 1

## 2018-08-01 NOTE — ED Triage Notes (Signed)
Pt reports vomiting since last night, unable to keep anything down, denies abd pain. Denies drug use. nad noted in triage

## 2018-08-02 LAB — URINALYSIS, ROUTINE W REFLEX MICROSCOPIC
Bilirubin Urine: NEGATIVE
Glucose, UA: NEGATIVE mg/dL
HGB URINE DIPSTICK: NEGATIVE
Ketones, ur: NEGATIVE mg/dL
Leukocytes, UA: NEGATIVE
Nitrite: NEGATIVE
Protein, ur: NEGATIVE mg/dL
Specific Gravity, Urine: 1.027 (ref 1.005–1.030)
pH: 6 (ref 5.0–8.0)

## 2018-08-02 LAB — CBC WITH DIFFERENTIAL/PLATELET
ABS IMMATURE GRANULOCYTES: 0.03 10*3/uL (ref 0.00–0.07)
Basophils Absolute: 0.1 10*3/uL (ref 0.0–0.1)
Basophils Relative: 1 %
Eosinophils Absolute: 0.1 10*3/uL (ref 0.0–0.5)
Eosinophils Relative: 1 %
HEMATOCRIT: 50 % (ref 39.0–52.0)
Hemoglobin: 16.1 g/dL (ref 13.0–17.0)
Immature Granulocytes: 0 %
LYMPHS ABS: 1.7 10*3/uL (ref 0.7–4.0)
LYMPHS PCT: 18 %
MCH: 26.1 pg (ref 26.0–34.0)
MCHC: 32.2 g/dL (ref 30.0–36.0)
MCV: 81 fL (ref 80.0–100.0)
MONO ABS: 1 10*3/uL (ref 0.1–1.0)
MONOS PCT: 10 %
NEUTROS ABS: 6.8 10*3/uL (ref 1.7–7.7)
Neutrophils Relative %: 70 %
Platelets: 328 10*3/uL (ref 150–400)
RBC: 6.17 MIL/uL — ABNORMAL HIGH (ref 4.22–5.81)
RDW: 14.5 % (ref 11.5–15.5)
WBC: 9.7 10*3/uL (ref 4.0–10.5)
nRBC: 0 % (ref 0.0–0.2)

## 2018-08-02 LAB — COMPREHENSIVE METABOLIC PANEL
ALBUMIN: 3.3 g/dL — AB (ref 3.5–5.0)
ALT: 51 U/L — ABNORMAL HIGH (ref 0–44)
AST: 31 U/L (ref 15–41)
Alkaline Phosphatase: 50 U/L (ref 38–126)
Anion gap: 11 (ref 5–15)
BILIRUBIN TOTAL: 0.9 mg/dL (ref 0.3–1.2)
BUN: 15 mg/dL (ref 6–20)
CO2: 23 mmol/L (ref 22–32)
Calcium: 8.7 mg/dL — ABNORMAL LOW (ref 8.9–10.3)
Chloride: 103 mmol/L (ref 98–111)
Creatinine, Ser: 1.3 mg/dL — ABNORMAL HIGH (ref 0.61–1.24)
GFR calc Af Amer: >60 mL/min (ref >60)
GFR calc non Af Amer: >60 mL/min (ref >60)
GLUCOSE: 107 mg/dL — AB (ref 70–99)
POTASSIUM: 3.9 mmol/L (ref 3.5–5.1)
Sodium: 137 mmol/L (ref 135–145)
TOTAL PROTEIN: 6.1 g/dL — AB (ref 6.5–8.1)

## 2018-08-02 LAB — LIPASE, BLOOD: Lipase: 26 U/L (ref 11–51)

## 2018-08-02 MED ORDER — ONDANSETRON HCL 4 MG/2ML IJ SOLN
4.0000 mg | Freq: Once | INTRAMUSCULAR | Status: AC
  Administered 2018-08-02: 4 mg via INTRAVENOUS
  Filled 2018-08-02: qty 2

## 2018-08-02 MED ORDER — ONDANSETRON HCL 4 MG PO TABS: 4.0000 mg | ORAL_TABLET | Freq: Four times a day (QID) | ORAL | 0 refills | Status: DC | PRN

## 2018-08-02 MED ORDER — SODIUM CHLORIDE 0.9 % IV BOLUS
1000.0000 mL | Freq: Once | INTRAVENOUS | Status: AC
  Administered 2018-08-02: 1000 mL via INTRAVENOUS

## 2018-08-02 NOTE — ED Notes (Signed)
Pt reports nausea and vomiting, states his om has been sick with similar symptoms, denies abdominal pain, reports vomiting 12 times in the last 24 hours.

## 2018-08-02 NOTE — ED Provider Notes (Signed)
[facility] EMERGENCY DEPARTMENT Provider Note   CSN: [id number] Arrival date & time: 08/01/18  2022     History   Chief Complaint Chief Complaint  Patient presents with  . Emesis    HPI Colton Flores is a 33 y.o. male.  The history is provided by the patient.  Emesis  He comes in with nausea and dry heaves for the last two days, and started vomiting today. He has vomited six times today.  He denies abdominal pain, fever, chills, sweats.  He has had a sick contact who had a similar illness.  Past Medical History:  Diagnosis Date  . Allergy     Patient Active Problem List   Diagnosis Date Noted  . Wrist pain 04/14/2011  . Screening cholesterol level 04/14/2011  . Exposure to viral disease 11/17/2010  . SINUSITIS 06/29/2010  . 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 KNEE SURGERY          Home Medications    Prior to Admission medications   Medication Sig Start Date End Date Taking? Authorizing Provider  acetaminophen (TYLENOL) 500 MG tablet Take 1,000 mg by mouth every 6 (six) hours as needed. For headache    [provider]  ibuprofen (ADVIL,MOTRIN) 800 MG tablet Take 1 tablet (800 mg total) by mouth 3 (three) times daily. 05/12/18   Wieters, Hallie C, PA-C  naproxen (NAPROSYN) 500 MG tablet Take 1 tablet (500 mg total) by mouth 2 (two) times daily with a meal. 11/20/15   Barbaraann Barthel, MD  naproxen sodium (ALEVE) 220 MG tablet Take 440 mg by mouth 2 (two) times daily as needed. For headache    [provider]    Family History Family History  Problem Relation Age of Onset  . Hypertension Mother   . Diabetes Father   . Hypertension Father   . Hyperlipidemia Father     Social History Social History   Tobacco Use  . Smoking status: Former Smoker    Types: Cigarettes  . Smokeless tobacco: Never Used  Substance Use Topics  . Alcohol use: Yes  . Drug use: No      Allergies   Shellfish allergy   Review of Systems Review of Systems  Gastrointestinal: Positive for vomiting.  All other systems reviewed and are negative.    Physical Exam Updated Vital Signs BP (!) 125/93 (BP Location: Right Arm)   Pulse (!) 58   Temp 98.3 F (36.8 C) (Oral)   Resp 16   SpO2 99%   Physical Exam Vitals signs and nursing note reviewed.    33 year old male, resting comfortably and in no acute distress. Vital signs are significant for elevated diastolic blood pressure. Oxygen saturation is 99%, which is normal. Head is normocephalic and atraumatic. PERRLA, EOMI. Oropharynx is clear. Neck is nontender and supple without adenopathy or JVD. Back is nontender and there is no CVA tenderness. Lungs are clear without rales, wheezes, or rhonchi. Chest is nontender. Heart has regular rate and rhythm without murmur. Abdomen is soft, flat, nontender without masses or hepatosplenomegaly and peristalsis is normoactive. Extremities have no cyanosis or edema, full range of motion is present. Skin is warm and dry without rash. Neurologic: Mental status is normal, cranial nerves are intact, there are no motor or sensory deficits.  ED Treatments / Results  Labs (all labs ordered are listed, but only abnormal results are displayed) Labs Reviewed  COMPREHENSIVE METABOLIC  PANEL - Abnormal; Notable for the following components:      Result Value   Glucose, Bld 107 (*)    Creatinine, Ser 1.30 (*)    Calcium 8.7 (*)    Total Protein 6.1 (*)    Albumin 3.3 (*)    ALT 51 (*)    All other components within normal limits  CBC WITH DIFFERENTIAL/PLATELET - Abnormal; Notable for the following components:   RBC 6.17 (*)    All other components within normal limits  LIPASE, BLOOD  URINALYSIS, ROUTINE W REFLEX MICROSCOPIC   Procedures Procedures   Medications Ordered in ED Medications  ondansetron (ZOFRAN) injection 4 mg (has no administration in time range)  sodium  chloride 0.9 % bolus 1,000 mL (has no administration in time range)  ondansetron (ZOFRAN-ODT) disintegrating tablet 4 mg (4 mg Oral Given 08/01/18 2112)     Initial Impression / Assessment and Plan / ED Course  I have reviewed the triage vital signs and the nursing notes.  Pertinent lab results that were available during my care of the patient were reviewed by me and considered in my medical decision making (see chart for details).  Nausea and vomiting, probable viral gastritis.  No red flags to suggest more serious illness.  He will be given IV fluids, ondansetron.  Will check screening labs.  Old records are reviewed, and he has no relevant past visits.  Labs show mild renal insufficiency which is not significantly changed from baseline.  He feels much better after above-noted treatment.  He is discharged with prescription for ondansetron.  Final Clinical Impressions(s) / ED Diagnoses   Final diagnoses:  Viral gastritis  Renal insufficiency    ED Discharge Orders         Ordered    ondansetron (ZOFRAN) 4 MG tablet  Every 6 hours PRN     08/02/18 0422           Dione Booze, MD 08/02/18 [phone]

## 2018-08-10 ENCOUNTER — Encounter (HOSPITAL_COMMUNITY): Payer: Self-pay | Admitting: Emergency Medicine

## 2018-08-10 ENCOUNTER — Emergency Department (HOSPITAL_COMMUNITY)
Admission: EM | Admit: 2018-08-10 | Discharge: 2018-08-10 | Disposition: A | Discharge status: Home / Self Care | Payer: Managed Care, Other (non HMO) | Attending: Emergency Medicine | Admitting: Emergency Medicine

## 2018-08-10 ENCOUNTER — Emergency Department (HOSPITAL_COMMUNITY): Payer: Managed Care, Other (non HMO)

## 2018-08-10 DIAGNOSIS — Z79899 Other long term (current) drug therapy: Secondary | ICD-10-CM | POA: Insufficient documentation

## 2018-08-10 DIAGNOSIS — Z87891 Personal history of nicotine dependence: Secondary | ICD-10-CM | POA: Insufficient documentation

## 2018-08-10 DIAGNOSIS — Z8249 Family history of ischemic heart disease and other diseases of the circulatory system: Secondary | ICD-10-CM | POA: Insufficient documentation

## 2018-08-10 DIAGNOSIS — R0789 Other chest pain: Secondary | ICD-10-CM | POA: Diagnosis present

## 2018-08-10 DIAGNOSIS — R079 Chest pain, unspecified: Secondary | ICD-10-CM

## 2018-08-10 LAB — BASIC METABOLIC PANEL
Anion gap: 9 (ref 5–15)
BUN: 13 mg/dL (ref 6–20)
CO2: 23 mmol/L (ref 22–32)
Calcium: 8.8 mg/dL — ABNORMAL LOW (ref 8.9–10.3)
Chloride: 106 mmol/L (ref 98–111)
Creatinine, Ser: 1.09 mg/dL (ref 0.61–1.24)
GFR calc Af Amer: >60 mL/min (ref >60)
GFR calc non Af Amer: >60 mL/min (ref >60)
Glucose, Bld: 112 mg/dL — ABNORMAL HIGH (ref 70–99)
Potassium: 3.9 mmol/L (ref 3.5–5.1)
Sodium: 138 mmol/L (ref 135–145)

## 2018-08-10 LAB — I-STAT TROPONIN, ED: Troponin i, poc: 0.03 ng/mL (ref 0.00–0.08)

## 2018-08-10 LAB — CBC
HCT: 47.3 % (ref 39.0–52.0)
Hemoglobin: 14.6 g/dL (ref 13.0–17.0)
MCH: 25.3 pg — ABNORMAL LOW (ref 26.0–34.0)
MCHC: 30.9 g/dL (ref 30.0–36.0)
MCV: 82.1 fL (ref 80.0–100.0)
Platelets: 287 10*3/uL (ref 150–400)
RBC: 5.76 MIL/uL (ref 4.22–5.81)
RDW: 14 % (ref 11.5–15.5)
WBC: 5.8 10*3/uL (ref 4.0–10.5)
nRBC: 0 % (ref 0.0–0.2)

## 2018-08-10 MED ORDER — SODIUM CHLORIDE 0.9% FLUSH: 3.0000 mL | Freq: Once | INTRAVENOUS | Status: DC

## 2018-08-10 MED ORDER — FAMOTIDINE 20 MG PO TABS: 20.0000 mg | ORAL_TABLET | Freq: Two times a day (BID) | ORAL | 0 refills | Status: DC

## 2018-08-10 MED ORDER — IBUPROFEN 600 MG PO TABS: 600.0000 mg | ORAL_TABLET | Freq: Four times a day (QID) | ORAL | 0 refills | Status: DC | PRN

## 2018-08-10 NOTE — Discharge Instructions (Addendum)
Please read attached information. If you experience any new or worsening signs or symptoms please return to the emergency room for evaluation. Please follow-up with your primary care provider or specialist as discussed. Please use medication prescribed only as directed and discontinue taking if you have any concerning signs or symptoms.   °

## 2018-08-10 NOTE — ED Notes (Signed)
Patient verbalizes understanding of discharge instructions. Opportunity for questioning and answers were provided. Pt discharged from ED. 

## 2018-08-10 NOTE — ED Triage Notes (Signed)
Chest pain for two days, worse upon movement forward and when bending over.  No other symptoms identified.

## 2018-08-10 NOTE — ED Provider Notes (Signed)
[facility] Provider Note   CSN: [id number] Arrival date & time: 08/10/18  0341     History   Chief Complaint Chief Complaint  Patient presents with  . Chest Pain    HPI Colton Flores is a 33 y.o. male.  HPI    33 year old male presents today with complaints of chest discomfort.  Patient notes over the last 2 days he has had intermittent tightness in his chest.  He notes that leaning forward makes it slightly worse, also cross body movement with his left arm.  Patient denies any associated shortness of breath, diaphoresis, nausea or vomiting.  He denies any history DVT or PE.  No smoking history.  He reports his mother has a history of heart failure, denies any personal cardiac history.  He notes when the pain comes it lasts for at max 30 minutes.  He notes minor discomfort presently. leanign forward makes it worse.  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  He was at work yesterday and was told by his boss that he needed to come to the hospital to get a excuse if he would be leaving work early.      Past Medical History:  Diagnosis Date  . Allergy     Patient Active Problem List   Diagnosis Date Noted  . Wrist pain 04/14/2011  . Screening cholesterol level 04/14/2011  . Exposure to viral disease 11/17/2010  . SINUSITIS 06/29/2010  . 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 KNEE SURGERY          Home Medications    Prior to Admission medications   Medication Sig Start Date End Date Taking? Authorizing Provider  acetaminophen (TYLENOL) 500 MG tablet Take 1,000 mg by mouth every 6 (six) hours as needed. For headache    [provider]  famotidine (PEPCID) 20 MG tablet Take 1 tablet (20 mg total) by mouth 2 (two) times daily. 08/10/18   Daianna Vasques, Tinnie GensJeffrey, PA-C  ibuprofen (ADVIL,MOTRIN) 600 MG tablet Take 1 tablet (600 mg  total) by mouth every 6 (six) hours as needed. 08/10/18   Joslin Doell, Tinnie GensJeffrey, PA-C  naproxen (NAPROSYN) 500 MG tablet Take 1 tablet (500 mg total) by mouth 2 (two) times daily with a meal. 11/20/15   Barbaraann BarthelBreen, James O, MD  naproxen sodium (ALEVE) 220 MG tablet Take 440 mg by mouth 2 (two) times daily as needed. For headache    [provider]  ondansetron (ZOFRAN) 4 MG tablet Take 1 tablet (4 mg total) by mouth every 6 (six) hours as needed for nausea or vomiting. 08/02/18   Dione BoozeGlick, David, MD    Family History Family History  Problem Relation Age of Onset  . Hypertension Mother   . Diabetes Father   . Hypertension Father   . Hyperlipidemia Father     Social History Social History   Tobacco Use  . Smoking status: Former Smoker    Types: Cigarettes  . Smokeless tobacco: Never Used  Substance Use Topics  . Alcohol use: Yes  . Drug use: No     Allergies   Shellfish allergy   Review of Systems Review of Systems  All other systems reviewed and are negative.    Physical Exam Updated Vital Signs BP (!) 131/101   Pulse 67   Temp 97.8 F (36.6 C) (Oral)   Resp 16   Ht 5\' 7"  (  1.702 m)   Wt 93 kg   SpO2 99%   BMI 32.11 kg/m   Physical Exam Vitals signs and nursing note reviewed.  Constitutional:      Appearance: He is well-developed.  HENT:     Head: Normocephalic and atraumatic.  Eyes:     General: No scleral icterus.       Right eye: No discharge.        Left eye: No discharge.     Conjunctiva/sclera: Conjunctivae normal.     Pupils: Pupils are equal, round, and reactive to light.  Neck:     Musculoskeletal: Normal range of motion.     Vascular: No JVD.     Trachea: No tracheal deviation.  Cardiovascular:     Rate and Rhythm: Normal rate and regular rhythm.  Pulmonary:     Effort: Pulmonary effort is normal. No respiratory distress.     Breath sounds: Normal breath sounds. No stridor. No wheezing, rhonchi or rales.  Chest:     Chest wall: No tenderness.    Musculoskeletal: Normal range of motion.        General: No swelling.  Neurological:     Mental Status: He is alert and oriented to person, place, and time.     Coordination: Coordination normal.  Psychiatric:        Behavior: Behavior normal.        Thought Content: Thought content normal.        Judgment: Judgment normal.      ED Treatments / Results  Labs (all labs ordered are listed, but only abnormal results are displayed) Labs Reviewed  BASIC METABOLIC PANEL - Abnormal; Notable for the following components:      Result Value   Glucose, Bld 112 (*)    Calcium 8.8 (*)    All other components within normal limits  CBC - Abnormal; Notable for the following components:   MCH 25.3 (*)    All other components within normal limits  I-STAT TROPONIN, ED    EKG EKG Interpretation  Date/Time:  Friday August 10 2018 03:52:55 EST Ventricular Rate:  72 PR Interval:  140 QRS Duration: 80 QT Interval:  370 QTC Calculation: 405 R Axis:   92 Text Interpretation:  Normal sinus rhythm with sinus arrhythmia Rightward axis Nonspecific T wave abnormality Abnormal ECG No old tracing to compare Confirmed by Dione Booze ([id number]) on 08/10/2018 6:43:43 AM   Radiology Dg Chest 2 View  Result Date: 08/10/2018 CLINICAL DATA:  Chest pain for 2 days EXAM: CHEST - 2 VIEW COMPARISON:  01/09/2010 FINDINGS: Normal heart size and mediastinal contours. No acute infiltrate or edema. No effusion or pneumothorax. No acute osseous findings. IMPRESSION: Negative chest. Electronically Signed   By: Marnee Spring M.D.   On: 08/10/2018 04:30    Procedures Procedures (including critical care time)  Medications Ordered in ED Medications  sodium chloride flush (NS) 0.9 % injection 3 mL (has no administration in time range)     Initial Impression / Assessment and Plan / ED Course  I have reviewed the triage vital signs and the nursing notes.  Pertinent labs & imaging results that were available  during my care of the patient were reviewed by me and considered in my medical decision making (see chart for details).       Assessment/Plan: 33 year old male presents today with complaints of chest discomfort.  I have very low suspicion for ACS, PE, dissection, or any other significant thoracic abnormality.  Patient is  well-appearing in no acute distress, high suspicion for chest wall pain.  Patient has reassuring EKG negative troponin, he will be discharged with strict return precautions, symptomatic care.  Patient is recently getting over a GI illness so esophagitis in the differential, he will be started on Pepcid, anti-inflammatories.  Patient verbalized understanding and agreement to today's plan had no further questions or concerns.    Final Clinical Impressions(s) / ED Diagnoses   Final diagnoses:  Chest pain, unspecified type    ED Discharge Orders         Ordered    famotidine (PEPCID) 20 MG tablet  2 times daily     08/10/18 0758    ibuprofen (ADVIL,MOTRIN) 600 MG tablet  Every 6 hours PRN     08/10/18 0758           Eyvonne Mechanic, PA-C 08/10/18 0801    Mesner, Barbara Cower, MD 08/10/18 [phone]

## 2018-09-09 ENCOUNTER — Ambulatory Visit (INDEPENDENT_AMBULATORY_CARE_PROVIDER_SITE_OTHER): Payer: Managed Care, Other (non HMO)

## 2018-09-09 ENCOUNTER — Ambulatory Visit (HOSPITAL_COMMUNITY)
Admission: EM | Admit: 2018-09-09 | Discharge: 2018-09-09 | Disposition: A | Discharge status: Home / Self Care | Payer: Managed Care, Other (non HMO) | Attending: Internal Medicine | Admitting: Internal Medicine

## 2018-09-09 ENCOUNTER — Ambulatory Visit (HOSPITAL_COMMUNITY): Payer: Managed Care, Other (non HMO)

## 2018-09-09 ENCOUNTER — Encounter (HOSPITAL_COMMUNITY): Payer: Self-pay | Admitting: Emergency Medicine

## 2018-09-09 DIAGNOSIS — G588 Other specified mononeuropathies: Secondary | ICD-10-CM

## 2018-09-09 DIAGNOSIS — M79642 Pain in left hand: Secondary | ICD-10-CM | POA: Diagnosis not present

## 2018-09-09 MED ORDER — MELOXICAM 7.5 MG PO TABS: 7.5000 mg | ORAL_TABLET | Freq: Two times a day (BID) | ORAL | 0 refills | Status: AC | PRN

## 2018-09-09 NOTE — ED Triage Notes (Signed)
Pt states he works really hard at work with his hands and denies injury, but states for 5 days hes had L hand pain with movement. No bruising or swelling noted.

## 2018-11-08 ENCOUNTER — Ambulatory Visit (INDEPENDENT_AMBULATORY_CARE_PROVIDER_SITE_OTHER): Payer: Managed Care, Other (non HMO)

## 2018-11-08 ENCOUNTER — Ambulatory Visit (HOSPITAL_COMMUNITY)
Admission: EM | Admit: 2018-11-08 | Discharge: 2018-11-08 | Disposition: A | Discharge status: Home / Self Care | Payer: Managed Care, Other (non HMO) | Attending: Internal Medicine | Admitting: Internal Medicine

## 2018-11-08 ENCOUNTER — Encounter (HOSPITAL_COMMUNITY): Payer: Self-pay

## 2018-11-08 ENCOUNTER — Other Ambulatory Visit: Payer: Self-pay

## 2018-11-08 DIAGNOSIS — S838X2A Sprain of other specified parts of left knee, initial encounter: Secondary | ICD-10-CM

## 2018-11-08 NOTE — ED Provider Notes (Signed)
[facility]    CSN: [id number] Arrival date & time: 11/08/18  1605     History   Chief Complaint Chief Complaint  Patient presents with  . Knee Pain    HPI Colton Flores is a 33 y.o. male.   The history is provided by the patient. No language interpreter was used.  Knee Pain  Location:  Knee Injury: no   Knee location:  L knee Pain details:    Quality:  Aching   Radiates to:  Does not radiate   Severity:  Moderate   Onset quality:  Gradual   Timing:  Constant   Progression:  Worsening Chronicity:  New Foreign body present:  No foreign bodies Relieved by:  Nothing Worsened by:  Nothing Ineffective treatments:  None tried Risk factors: no concern for non-accidental trauma   Pt fell and hit his left knee.  Pt had a patella tendon repair in the past by Dr. Linna CapriceSwinteck.  Pt has swelling.   Past Medical History:  Diagnosis Date  . Allergy     Patient Active Problem List   Diagnosis Date Noted  . Wrist pain 04/14/2011  . Screening cholesterol level 04/14/2011  . Exposure to viral disease 11/17/2010  . SINUSITIS 06/29/2010  . 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 KNEE SURGERY         Home Medications    Prior to Admission medications   Medication Sig Start Date End Date Taking? Authorizing Provider  ibuprofen (ADVIL,MOTRIN) 600 MG tablet Take 1 tablet (600 mg total) by mouth every 6 (six) hours as needed. 08/10/18  Yes Hedges, Tinnie GensJeffrey, PA-C  acetaminophen (TYLENOL) 500 MG tablet Take 1,000 mg by mouth every 6 (six) hours as needed. For headache    [provider]  famotidine (PEPCID) 20 MG tablet Take 1 tablet (20 mg total) by mouth 2 (two) times daily. Patient not taking: Reported on 09/09/2018 08/10/18   Hedges, Tinnie GensJeffrey, PA-C  naproxen (NAPROSYN) 500 MG tablet Take 1 tablet (500 mg total) by mouth 2 (two) times daily with a meal. Patient not taking: Reported on 09/09/2018 11/20/15   Barbaraann BarthelBreen,  James O, MD  naproxen sodium (ALEVE) 220 MG tablet Take 440 mg by mouth 2 (two) times daily as needed. For headache    [provider]  ondansetron (ZOFRAN) 4 MG tablet Take 1 tablet (4 mg total) by mouth every 6 (six) hours as needed for nausea or vomiting. Patient not taking: Reported on 09/09/2018 08/02/18   Dione BoozeGlick, David, MD    Family History Family History  Problem Relation Age of Onset  . Hypertension Mother   . Diabetes Father   . Hypertension Father   . Hyperlipidemia Father     Social History Social History   Tobacco Use  . Smoking status: Former Smoker    Types: Cigarettes  . Smokeless tobacco: Never Used  Substance Use Topics  . Alcohol use: Yes  . Drug use: No     Allergies   Shellfish allergy and Iodine   Review of Systems Review of Systems  All other systems reviewed and are negative.    Physical Exam Triage Vital Signs ED Triage Vitals  Enc Vitals Group     BP 11/08/18 1618 134/81     Pulse Rate 11/08/18 1618 (!) 57     Resp 11/08/18 1618 18     Temp 11/08/18 1618 98.1 F (36.7 C)     Temp src --  SpO2 11/08/18 1618 99 %     Weight --      Height --      Head Circumference --      Peak Flow --      Pain Score 11/08/18 1620 5     Pain Loc --      Pain Edu? --      Excl. in GC? --    No data found.  Updated Vital Signs BP 134/81 (BP Location: Left Arm)   Pulse (!) 57   Temp 98.1 F (36.7 C)   Resp 18   SpO2 99%   Visual Acuity Right Eye Distance:   Left Eye Distance:   Bilateral Distance:    Right Eye Near:   Left Eye Near:    Bilateral Near:     Physical Exam Vitals signs and nursing note reviewed.  Constitutional:      Appearance: He is well-developed.  HENT:     Head: Normocephalic and atraumatic.  Eyes:     Conjunctiva/sclera: Conjunctivae normal.  Neck:     Musculoskeletal: Neck supple.  Cardiovascular:     Rate and Rhythm: Normal rate and regular rhythm.     Heart sounds: No murmur.  Pulmonary:      Effort: Pulmonary effort is normal. No respiratory distress.     Breath sounds: Normal breath sounds.  Abdominal:     Palpations: Abdomen is soft.     Tenderness: There is no abdominal tenderness.  Musculoskeletal:        General: Swelling and tenderness present.     Comments: Tender left knee, slight effusion,  From  Patella tendon intact,  No medical or lateral instability  Negative drawer.    Skin:    General: Skin is warm and dry.  Neurological:     Mental Status: He is alert.      UC Treatments / Results  Labs (all labs ordered are listed, but only abnormal results are displayed) Labs Reviewed - No data to display  EKG None  Radiology Dg Knee Complete 4 Views Left  Result Date: 11/08/2018 CLINICAL DATA:  Fall yesterday EXAM: LEFT KNEE - COMPLETE 4+ VIEW COMPARISON:  03/03/2016 FINDINGS: Negative for fracture. Normal alignment. No significant disc space narrowing. 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 IMPRESSION: Negative for fracture. Electronically Signed   By: Marlan Palau M.D.   On: 11/08/2018 17:07    Procedures Procedures (including critical care time)  Medications Ordered in UC Medications - No data to display  Initial Impression / Assessment and Plan / UC Course  I have reviewed the triage vital signs and the nursing notes.  Pertinent labs & imaging results that were available during my care of the patient were reviewed by me and considered in my medical decision making (see chart for details).     MDM  Xray reviewed and discussed with pt.  Pt advised to follow up with Dr. Linna Caprice if symptoms persist  Final Clinical Impressions(s) / UC Diagnoses   Final diagnoses:  Sprain of other ligament of left knee, initial encounter   Discharge Instructions   None    ED Prescriptions    None     Controlled Substance Prescriptions [place] Controlled Substance Registry consulted?  Not Applicable  An After Visit Summary was printed and given to the patient.    Elson Areas, [place] 11/08/18 1743

## 2018-11-08 NOTE — Discharge Instructions (Addendum)
Return if any problems.  Follow up with your Orthopaedist for recheck if pain persist

## 2018-11-08 NOTE — ED Triage Notes (Signed)
C/o left knee pain after fall at work, pain above patella

## 2018-11-24 ENCOUNTER — Encounter (HOSPITAL_COMMUNITY): Payer: Self-pay | Admitting: Emergency Medicine

## 2018-11-24 ENCOUNTER — Other Ambulatory Visit: Payer: Self-pay

## 2018-11-24 ENCOUNTER — Ambulatory Visit (HOSPITAL_COMMUNITY)
Admission: EM | Admit: 2018-11-24 | Discharge: 2018-11-24 | Disposition: A | Discharge status: Home / Self Care | Payer: Managed Care, Other (non HMO)

## 2018-11-24 DIAGNOSIS — K30 Functional dyspepsia: Secondary | ICD-10-CM

## 2018-11-24 DIAGNOSIS — R110 Nausea: Secondary | ICD-10-CM

## 2018-11-24 NOTE — ED Provider Notes (Signed)
[facility]    CSN: [id number] Arrival date & time: 11/24/18  1131     History   Chief Complaint Chief Complaint  Patient presents with  . Nausea    HPI Colton Flores is a 33 y.o. male.   HPI  Patient being seen today as a requirement to return to work per employer after experiencing one episode of nausea time last night. He endorses abdominal upset. He is only tolerating Ginger-ale and fluids. No further episode of nausea. He is experiencing slight sore throat. He has not taken any ibuprofen or tylenol. He is employed at [employer] center and there have been other employees that have tested positive for COVID-19. He denies any known contact with anyone infected with COVID-19 or any other illness. He has remained afebrile, denies shortness of breath, cough, body aches, or weakness.  Past Medical History:  Diagnosis Date  . Allergy     Patient Active Problem List   Diagnosis Date Noted  . Wrist pain 04/14/2011  . Screening cholesterol level 04/14/2011  . Exposure to viral disease 11/17/2010  . SINUSITIS 06/29/2010  . 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 KNEE SURGERY        Home Medications    Prior to Admission medications   Medication Sig Start Date End Date Taking? Authorizing Provider  acetaminophen (TYLENOL) 500 MG tablet Take 1,000 mg by mouth every 6 (six) hours as needed. For headache    [provider]  famotidine (PEPCID) 20 MG tablet Take 1 tablet (20 mg total) by mouth 2 (two) times daily. Patient not taking: Reported on 09/09/2018 08/10/18   Hedges, Tinnie Gens, PA-C  ibuprofen (ADVIL,MOTRIN) 600 MG tablet Take 1 tablet (600 mg total) by mouth every 6 (six) hours as needed. 08/10/18   Hedges, Tinnie Gens, PA-C  naproxen (NAPROSYN) 500 MG tablet Take 1 tablet (500 mg total) by mouth 2 (two) times daily with a meal. Patient not taking: Reported on 09/09/2018 11/20/15   Barbaraann Barthel, MD  naproxen sodium (ALEVE) 220 MG tablet Take 440 mg by mouth 2 (two) times daily as needed. For headache    [provider]  ondansetron (ZOFRAN) 4 MG tablet Take 1 tablet (4 mg total) by mouth every 6 (six) hours as needed for nausea or vomiting. Patient not taking: Reported on 09/09/2018 08/02/18   Dione Booze, MD    Family History Family History  Problem Relation Age of Onset  . Hypertension Mother   . Diabetes Father   . Hypertension Father   . Hyperlipidemia Father     Social History Social History   Tobacco Use  . Smoking status: Former Smoker    Types: Cigarettes  . Smokeless tobacco: Never Used  Substance Use Topics  . Alcohol use: Yes  . Drug use: No   Allergies   Shellfish allergy and Iodine Review of Systems Review of Systems Pertinent negatives listed in HPI  Physical Exam Triage Vital Signs ED Triage Vitals  Enc Vitals Group     BP 11/24/18 1146 130/81     Pulse Rate 11/24/18 1146 68     Resp 11/24/18 1146 16     Temp 11/24/18 1146 98.4 F (36.9 C)     Temp Source 11/24/18 1146 Oral     SpO2 11/24/18 1146 99 %     Weight --      Height --      Head Circumference --  Peak Flow --      Pain Score 11/24/18 1151 0     Pain Loc --      Pain Edu? --      Excl. in GC? --    No data found.  Updated Vital Signs BP 130/81 (BP Location: Left Arm)   Pulse 68   Temp 98.4 F (36.9 C) (Oral)   Resp 16   SpO2 99%   Visual Acuity Right Eye Distance:   Left Eye Distance:   Bilateral Distance:    Right Eye Near:   Left Eye Near:    Bilateral Near:     Physical Exam General appearance: alert, well developed, well nourished, cooperative and in no distress Head: Normocephalic, without obvious abnormality, atraumatic Respiratory: Respirations even and unlabored, normal respiratory rate Heart: rate and rhythm normal. No gallop or murmurs noted on exam  Abdomen: BS +, no distention, no rebound tenderness, or no mass Extremities: No  gross deformities Skin: Skin color, texture, turgor normal. No rashes seen  Psych: Appropriate mood and affect. Neurologic: Mental status: Alert, oriented to person, place, and time, thought content appropriate. UC Treatments / Results  Labs (all labs ordered are listed, but only abnormal results are displayed) Labs Reviewed - No data to display  EKG None  Radiology No results found.  Procedures Procedures (including critical care time)  Medications Ordered in UC Medications - No data to display  Initial Impression / Assessment and Plan / UC Course  I have reviewed the triage vital signs and the nursing notes.  Pertinent labs & imaging results that were available during my care of the patient were reviewed by me and considered in my medical decision making (see chart for details).   Recommended social isolation for the next 3 days. Provided a work note allowing for return to work in 72 hours if symptom free and fever. Red flags discussed with patient. He agreed to follow-up if symptoms worsened or do not improve.  Final Clinical Impressions(s) / UC Diagnoses   Final diagnoses:  Nausea  Upset stomach   Discharge Instructions   None    ED Prescriptions    None     Controlled Substance Prescriptions [place] Controlled Substance Registry consulted? Not Applicable   Bing NeighborsHarris, Arma Reining S, FNP 11/24/18 [phone]

## 2018-11-24 NOTE — ED Triage Notes (Signed)
Vomited at work last night x 1 , employer sent him home.  Patient needs a work note.    Slight sore throat No abdominal pain Denies coughs Denies sob  No vomiting today, drinking liquids wwithout issue

## 2018-12-31 ENCOUNTER — Other Ambulatory Visit: Payer: Self-pay

## 2018-12-31 ENCOUNTER — Encounter (HOSPITAL_COMMUNITY): Payer: Self-pay | Admitting: Emergency Medicine

## 2018-12-31 ENCOUNTER — Ambulatory Visit (HOSPITAL_COMMUNITY)
Admission: EM | Admit: 2018-12-31 | Discharge: 2018-12-31 | Disposition: A | Discharge status: Home / Self Care | Payer: Managed Care, Other (non HMO) | Attending: Family Medicine | Admitting: Family Medicine

## 2018-12-31 DIAGNOSIS — M545 Low back pain, unspecified: Secondary | ICD-10-CM

## 2018-12-31 MED ORDER — CYCLOBENZAPRINE HCL 5 MG PO TABS: 5.0000 mg | ORAL_TABLET | Freq: Every day | ORAL | 0 refills | Status: DC

## 2018-12-31 MED ORDER — NAPROXEN 500 MG PO TABS: 500.0000 mg | ORAL_TABLET | Freq: Two times a day (BID) | ORAL | 0 refills | Status: DC

## 2018-12-31 NOTE — ED Triage Notes (Signed)
Pain started 3 days ago.  No known injury.  Pain is in lower back, no pain in legs

## 2018-12-31 NOTE — Discharge Instructions (Signed)
Light and regular activity as tolerated.  See exercises provided.  Heat application while active can help with muscle spasms.  Sleep with pillow under your knees.   Naproxen twice a day, take with food.don't take additional aleve.  Flexeril as a muscle relaxer. May cause drowsiness. Please do not take if driving or drinking alcohol.   If symptoms worsen or do not improve in the next 2-3 weeks to follow up with your PCP.

## 2018-12-31 NOTE — ED Provider Notes (Signed)
[facility]    CSN: [id number] Arrival date & time: 12/31/18  1554     History   Chief Complaint Chief Complaint  Patient presents with  . Back Pain    HPI Colton Flores is a 33 y.o. male.   Colton Flores presents with complaints of right sided low back pain which started 3 days ago and is worsening. Worse with aching, bending. It is sharp and squeezing. No pain into buttocks or thighs. No saddle paresthesia loss of bladder or bowel. Took aleve which didn't seem to help. No known injury but does lift at his job, he works in [employer], [employer]. Denies any previous similar. Without contributing medical history.      ROS per HPI, negative if not otherwise mentioned.      Past Medical History:  Diagnosis Date  . Allergy     Patient Active Problem List   Diagnosis Date Noted  . Wrist pain 04/14/2011  . Screening cholesterol level 04/14/2011  . Exposure to viral disease 11/17/2010  . SINUSITIS 06/29/2010  . 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 KNEE SURGERY         Home Medications    Prior to Admission medications   Medication Sig Start Date End Date Taking? Authorizing Provider  acetaminophen (TYLENOL) 500 MG tablet Take 1,000 mg by mouth every 6 (six) hours as needed. For headache    [provider]  cyclobenzaprine (FLEXERIL) 5 MG tablet Take 1 tablet (5 mg total) by mouth at bedtime. 12/31/18   Georgetta HaberBurky, Natalie B, NP  ibuprofen (ADVIL,MOTRIN) 600 MG tablet Take 1 tablet (600 mg total) by mouth every 6 (six) hours as needed. 08/10/18   Hedges, Tinnie GensJeffrey, PA-C  naproxen (NAPROSYN) 500 MG tablet Take 1 tablet (500 mg total) by mouth 2 (two) times daily. 12/31/18   Georgetta HaberBurky, Natalie B, NP    Family History Family History  Problem Relation Age of Onset  . Hypertension Mother   . Diabetes Father   . Hypertension Father   . Hyperlipidemia Father     Social History Social History   Tobacco Use  .  Smoking status: Former Smoker    Types: Cigarettes  . Smokeless tobacco: Never Used  Substance Use Topics  . Alcohol use: Yes  . Drug use: No     Allergies   Shellfish allergy and Iodine   Review of Systems Review of Systems   Physical Exam Triage Vital Signs ED Triage Vitals  Enc Vitals Group     BP 12/31/18 1716 126/81     Pulse Rate 12/31/18 1716 65     Resp 12/31/18 1716 16     Temp 12/31/18 1716 98.3 F (36.8 C)     Temp Source 12/31/18 1716 Oral     SpO2 12/31/18 1716 98 %     Weight --      Height --      Head Circumference --      Peak Flow --      Pain Score 12/31/18 1713 6     Pain Loc --      Pain Edu? --      Excl. in [place]? --    No data found.  Updated Vital Signs BP 126/81 (BP Location: Right Arm) Comment (BP Location): large cuff  Pulse 65   Temp 98.3 F (36.8 C) (Oral)   Resp 16   SpO2 98%   Visual Acuity Right Eye  Distance:   Left Eye Distance:   Bilateral Distance:    Right Eye Near:   Left Eye Near:    Bilateral Near:     Physical Exam Constitutional:      Appearance: He is well-developed.  Cardiovascular:     Rate and Rhythm: Normal rate and regular rhythm.  Pulmonary:     Effort: Pulmonary effort is normal.     Breath sounds: Normal breath sounds.  Musculoskeletal:     Lumbar back: He exhibits tenderness and pain. He exhibits normal range of motion, no bony tenderness, no swelling, no edema, no deformity, no laceration, no spasm and normal pulse.       Back:     Comments: Soft tissue tenderness to right low back without spinous process tenderness; negative straight leg raise; no pain with hip flexion; strength equal bilaterally; gross sensation intact to lower extremities  Skin:    General: Skin is warm and dry.  Neurological:     Mental Status: He is alert and oriented to person, place, and time.      UC Treatments / Results  Labs (all labs ordered are listed, but only abnormal results are displayed) Labs Reviewed -  No data to display  EKG None  Radiology No results found.  Procedures Procedures (including critical care time)  Medications Ordered in UC Medications - No data to display  Initial Impression / Assessment and Plan / UC Course  I have reviewed the triage vital signs and the nursing notes.  Pertinent labs & imaging results that were available during my care of the patient were reviewed by me and considered in my medical decision making (see chart for details).     H&P consistent with low back strain. Pain management discussed. Follow up with PCP as needed. Patient verbalized understanding and agreeable to plan.  Ambulatory out of clinic without difficulty.    Final Clinical Impressions(s) / UC Diagnoses   Final diagnoses:  Acute right-sided low back pain without sciatica     Discharge Instructions     Light and regular activity as tolerated.  See exercises provided.  Heat application while active can help with muscle spasms.  Sleep with pillow under your knees.   Naproxen twice a day, take with food.don't take additional aleve.  Flexeril as a muscle relaxer. May cause drowsiness. Please do not take if driving or drinking alcohol.   If symptoms worsen or do not improve in the next 2-3 weeks to follow up with your PCP.     ED Prescriptions    Medication Sig Dispense Auth. Provider   naproxen (NAPROSYN) 500 MG tablet Take 1 tablet (500 mg total) by mouth 2 (two) times daily. 30 tablet Augusto Gamble B, NP   cyclobenzaprine (FLEXERIL) 5 MG tablet Take 1 tablet (5 mg total) by mouth at bedtime. 15 tablet Zigmund Gottron, NP     Controlled Substance Prescriptions [place] Controlled Substance Registry consulted? Not Applicable   Zigmund Gottron, NP 12/31/18 1858

## 2019-01-23 ENCOUNTER — Ambulatory Visit (HOSPITAL_COMMUNITY)
Admission: EM | Admit: 2019-01-23 | Discharge: 2019-01-23 | Disposition: A | Discharge status: Home / Self Care | Payer: Managed Care, Other (non HMO) | Attending: Emergency Medicine | Admitting: Emergency Medicine

## 2019-01-23 ENCOUNTER — Ambulatory Visit (INDEPENDENT_AMBULATORY_CARE_PROVIDER_SITE_OTHER): Payer: Managed Care, Other (non HMO)

## 2019-01-23 ENCOUNTER — Encounter (HOSPITAL_COMMUNITY): Payer: Self-pay

## 2019-01-23 ENCOUNTER — Other Ambulatory Visit: Payer: Self-pay

## 2019-01-23 DIAGNOSIS — M79672 Pain in left foot: Secondary | ICD-10-CM

## 2019-01-23 MED ORDER — IBUPROFEN 800 MG PO TABS: 800.0000 mg | ORAL_TABLET | Freq: Three times a day (TID) | ORAL | 0 refills | Status: DC

## 2019-01-23 NOTE — Discharge Instructions (Signed)
Use anti-inflammatories for pain/swelling. You may take up to 800 mg Ibuprofen every 8 hours with food. You may supplement Ibuprofen with Tylenol [phone] mg every 8 hours.   Keep foot elevated when resting  May try over the counter wart remover if developing more appearance of a wart Follow up if developing increased redness or pain

## 2019-01-23 NOTE — ED Triage Notes (Signed)
Patient presents to Urgent Care with complaints of left foot pain since a week ago when he stepped through a pallet at work. Patient is able to walk with steady gait, has been taking aleve at home.

## 2019-01-24 NOTE — ED Provider Notes (Signed)
[facility]    CSN: [id number] Arrival date & time: 01/23/19  1225      History   Chief Complaint Chief Complaint  Patient presents with  . Foot Burn    HPI Colton Flores is a 33 y.o. male no contributing past medical history presenting today for evaluation of left foot pain.  Patient states that a week ago he was at work and stepped on a palate which broke on him and he fell through.  He was wearing shoes.  Did not notice any foreign body going through shoe foot.  Since he is had continued discomfort to the bottom of his heel as well as the ball of his foot.  Most of the pain is felt on the plantar surface and towards the ball of the feet.  He has tried Aleve without significant relief.  He denies any changes to the skin.  Denies previous issues with his foot.  Denies any numbness or tingling.  HPI  Past Medical History:  Diagnosis Date  . Allergy     Patient Active Problem List   Diagnosis Date Noted  . Wrist pain 04/14/2011  . Screening cholesterol level 04/14/2011  . Exposure to viral disease 11/17/2010  . SINUSITIS 06/29/2010  . 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 KNEE SURGERY         Home Medications    Prior to Admission medications   Medication Sig Start Date End Date Taking? Authorizing Provider  acetaminophen (TYLENOL) 500 MG tablet Take 1,000 mg by mouth every 6 (six) hours as needed. For headache    [provider]  cyclobenzaprine (FLEXERIL) 5 MG tablet Take 1 tablet (5 mg total) by mouth at bedtime. 12/31/18   Georgetta HaberBurky, Natalie B, NP  ibuprofen (ADVIL) 800 MG tablet Take 1 tablet (800 mg total) by mouth 3 (three) times daily. 01/23/19   Jurell Basista C, PA-C  naproxen (NAPROSYN) 500 MG tablet Take 1 tablet (500 mg total) by mouth 2 (two) times daily. 12/31/18   Georgetta HaberBurky, Natalie B, NP    Family History Family History  Problem Relation Age of Onset  . Hypertension Mother   . Diabetes  Father   . Hypertension Father   . Hyperlipidemia Father     Social History Social History   Tobacco Use  . Smoking status: Former Smoker    Types: Cigarettes  . Smokeless tobacco: Never Used  Substance Use Topics  . Alcohol use: Yes  . Drug use: No     Allergies   Shellfish allergy and Iodine   Review of Systems Review of Systems  Constitutional: Negative for fatigue and fever.  Eyes: Negative for redness, itching and visual disturbance.  Respiratory: Negative for shortness of breath.   Cardiovascular: Negative for chest pain and leg swelling.  Gastrointestinal: Negative for nausea and vomiting.  Musculoskeletal: Positive for arthralgias and gait problem. Negative for myalgias.  Skin: Negative for color change, rash and wound.  Neurological: Negative for dizziness, syncope, weakness, light-headedness and headaches.     Physical Exam Triage Vital Signs ED Triage Vitals  Enc Vitals Group     BP 01/23/19 1244 130/90     Pulse Rate 01/23/19 1244 72     Resp 01/23/19 1244 18     Temp 01/23/19 1244 97.7 F (36.5 C)     Temp Source 01/23/19 1244 Oral     SpO2 01/23/19 1244 100 %     Weight --  Height --      Head Circumference --      Peak Flow --      Pain Score 01/23/19 1243 0     Pain Loc --      Pain Edu? --      Excl. in [place]? --    No data found.  Updated Vital Signs BP 130/90 (BP Location: Left Arm)   Pulse 72   Temp 97.7 F (36.5 C) (Oral)   Resp 18   SpO2 100%   Visual Acuity Right Eye Distance:   Left Eye Distance:   Bilateral Distance:    Right Eye Near:   Left Eye Near:    Bilateral Near:     Physical Exam Vitals signs and nursing note reviewed.  Constitutional:      Appearance: He is well-developed.     Comments: No acute distress  HENT:     Head: Normocephalic and atraumatic.     Nose: Nose normal.  Eyes:     Conjunctiva/sclera: Conjunctivae normal.  Neck:     Musculoskeletal: Neck supple.  Cardiovascular:     Rate and  Rhythm: Normal rate.  Pulmonary:     Effort: Pulmonary effort is normal. No respiratory distress.  Abdominal:     General: There is no distension.  Musculoskeletal: Normal range of motion.     Comments: Left foot: No obvious deformity or swelling, no discoloration.  Nontender to palpation of medial lateral malleolus, nontender throughout dorsum of foot, dorsalis pedis 2+  Mild discomfort with palpation of heel as well as along ball of foot.  Seems to be most tender around ball of foot.  There is one slight area that appears slightly increase in yellow.  No obvious plantar wart, corn or callus.  No obvious foreign body, or open skin.  Skin:    General: Skin is warm and dry.  Neurological:     Mental Status: He is alert and oriented to person, place, and time.      UC Treatments / Results  Labs (all labs ordered are listed, but only abnormal results are displayed) Labs Reviewed - No data to display  EKG   Radiology Dg Foot Complete Left  Result Date: 01/23/2019 CLINICAL DATA:  Foot injury 1 week ago. Fell through broken palate. Pain 2 plantar surface near the ball of the foot. No notable foreign body. EXAM: LEFT FOOT - COMPLETE 3+ VIEW COMPARISON:  None. FINDINGS: There is a small osseous fragment adjacent to the medial malleolus which is likely related to an old remote injury. There is soft tissue swelling about the foot without evidence of a radiopaque foreign body or acute displaced fracture or dislocation. IMPRESSION: Soft tissue swelling without evidence of an acute osseous abnormality or radiopaque foreign body. Electronically Signed   By: Constance Holster M.D.   On: 01/23/2019 14:01    Procedures Procedures (including critical care time)  Medications Ordered in UC Medications - No data to display  Initial Impression / Assessment and Plan / UC Course  I have reviewed the triage vital signs and the nursing notes.  Pertinent labs & imaging results that were available during  my care of the patient were reviewed by me and considered in my medical decision making (see chart for details).     X-ray negative for bony abnormality and is nonsuggestive of any foreign body.  1 week of plantar pain.  Unclear cause.  Will recommend anti-inflammatories and soaks.  Continue to monitor, follow-up with podiatry  if persisting.Discussed strict return precautions. Patient verbalized understanding and is agreeable with plan.  Final Clinical Impressions(s) / UC Diagnoses   Final diagnoses:  Left foot pain     Discharge Instructions     Use anti-inflammatories for pain/swelling. You may take up to 800 mg Ibuprofen every 8 hours with food. You may supplement Ibuprofen with Tylenol [phone] mg every 8 hours.   Keep foot elevated when resting  May try over the counter wart remover if developing more appearance of a wart Follow up if developing increased redness or pain   ED Prescriptions    Medication Sig Dispense Auth. Provider   ibuprofen (ADVIL) 800 MG tablet Take 1 tablet (800 mg total) by mouth 3 (three) times daily. 21 tablet Nishaan Stanke, FranklinHallie C, PA-C     Controlled Substance Prescriptions [place] Controlled Substance Registry consulted? Not Applicable   Lew DawesWieters, Alisse Tuite C, [place] JerseyPA-C 01/24/19 [phone]

## 2019-02-15 ENCOUNTER — Encounter (HOSPITAL_COMMUNITY): Payer: Self-pay | Admitting: Emergency Medicine

## 2019-02-15 ENCOUNTER — Ambulatory Visit (HOSPITAL_COMMUNITY)
Admission: EM | Admit: 2019-02-15 | Discharge: 2019-02-15 | Disposition: A | Discharge status: Home / Self Care | Payer: Managed Care, Other (non HMO) | Attending: Family Medicine | Admitting: Family Medicine

## 2019-02-15 ENCOUNTER — Other Ambulatory Visit: Payer: Self-pay

## 2019-02-15 DIAGNOSIS — M545 Low back pain, unspecified: Secondary | ICD-10-CM

## 2019-02-15 MED ORDER — MELOXICAM 7.5 MG PO TABS: 7.5000 mg | ORAL_TABLET | Freq: Every day | ORAL | 0 refills | Status: DC

## 2019-02-15 MED ORDER — CYCLOBENZAPRINE HCL 5 MG PO TABS: 5.0000 mg | ORAL_TABLET | Freq: Every day | ORAL | 0 refills | Status: DC

## 2019-02-15 NOTE — ED Triage Notes (Signed)
Pt has back pain on lower right side. On going pain for a while.

## 2019-02-15 NOTE — ED Provider Notes (Signed)
[facility]    CSN: [id number] Arrival date & time: 02/15/19  [id number]      History   Chief Complaint Chief Complaint  Patient presents with  . Back Pain    HPI Colton Flores is a 33 y.o. male.   Colton Flores presents with complaints of right sided low back pain which has worsened over the past 4 days. Worse with bending. Has had similar in the past, was seen for this 6/8. Had improved, especially when he was off of work for a few days. In returning to work pain returned. He lifts a lot, works at [employer]. No numbness tingling or weakness. No loss of bladder or bowel. No saddle paresthesia. Has tried taking aleve which has helped some. No radiation of pain. No specific injury. Doesn't have a PCP.     ROS per HPI, negative if not otherwise mentioned.      Past Medical History:  Diagnosis Date  . Allergy     Patient Active Problem List   Diagnosis Date Noted  . Wrist pain 04/14/2011  . Screening cholesterol level 04/14/2011  . Exposure to viral disease 11/17/2010  . SINUSITIS 06/29/2010  . 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 KNEE SURGERY         Home Medications    Prior to Admission medications   Medication Sig Start Date End Date Taking? Authorizing Provider  cyclobenzaprine (FLEXERIL) 5 MG tablet Take 1 tablet (5 mg total) by mouth at bedtime. 02/15/19   Georgetta HaberBurky,  B, NP  ibuprofen (ADVIL) 800 MG tablet Take 1 tablet (800 mg total) by mouth 3 (three) times daily. 01/23/19   Wieters, Hallie C, PA-C  meloxicam (MOBIC) 7.5 MG tablet Take 1 tablet (7.5 mg total) by mouth daily. 02/15/19   Georgetta HaberBurky,  B, NP    Family History Family History  Problem Relation Age of Onset  . Hypertension Mother   . Diabetes Father   . Hypertension Father   . Hyperlipidemia Father     Social History Social History   Tobacco Use  . Smoking status: Former Smoker    Types: Cigarettes  . Smokeless  tobacco: Never Used  Substance Use Topics  . Alcohol use: Yes  . Drug use: No     Allergies   Shellfish allergy and Iodine   Review of Systems Review of Systems   Physical Exam Triage Vital Signs ED Triage Vitals  Enc Vitals Group     BP 02/15/19 1029 (!) 139/93     Pulse Rate 02/15/19 1029 73     Resp 02/15/19 1029 18     Temp 02/15/19 1029 98 F (36.7 C)     Temp Source 02/15/19 1029 Oral     SpO2 02/15/19 1029 97 %     Weight --      Height --      Head Circumference --      Peak Flow --      Pain Score 02/15/19 1023 4     Pain Loc --      Pain Edu? --      Excl. in [place]? --    No data found.  Updated Vital Signs BP (!) 139/93 (BP Location: Left Arm)   Pulse 73   Temp 98 F (36.7 C) (Oral)   Resp 18   SpO2 97%   Visual Acuity Right Eye Distance:   Left Eye Distance:   Bilateral Distance:  Right Eye Near:   Left Eye Near:    Bilateral Near:     Physical Exam Constitutional:      Appearance: He is well-developed.  Cardiovascular:     Rate and Rhythm: Normal rate.  Pulmonary:     Effort: Pulmonary effort is normal.  Musculoskeletal:     Lumbar back: He exhibits tenderness and pain. He exhibits normal range of motion, no bony tenderness, no swelling and no edema.       Back:     Comments: pain with transition from sit to lay and lay to sit; pain with bilateral hip flexion to right low back; negative straight leg raise; strength equal bilaterally; gross sensation intact; no spinous process tenderness   Skin:    General: Skin is warm and dry.  Neurological:     Mental Status: He is alert and oriented to person, place, and time.      UC Treatments / Results  Labs (all labs ordered are listed, but only abnormal results are displayed) Labs Reviewed - No data to display  EKG   Radiology No results found.  Procedures Procedures (including critical care time)  Medications Ordered in UC Medications - No data to display  Initial  Impression / Assessment and Plan / UC Course  I have reviewed the triage vital signs and the nursing notes.  Pertinent labs & imaging results that were available during my care of the patient were reviewed by me and considered in my medical decision making (see chart for details).     Low back strain. Lifting technique and strengthening discussed. Encouraged follow up with physical therapy as well as PCP. Patient unsure if he will be able to afford physical therapy. Medications provided for pain control. Return precautions provided. Patient verbalized understanding and agreeable to plan.  Ambulatory out of clinic without difficulty.    Final Clinical Impressions(s) / UC Diagnoses   Final diagnoses:  Acute right-sided low back pain without sciatica     Discharge Instructions     Limit heavy lifting as able, see exercises provided.  Meloxicam daily, don't take additional ibuprofen or aleve.  Flexeril at night. May cause drowsiness. Please do not take if driving or drinking alcohol.   I do recommend physical therapy if this is something you will be able to do, you will be called to get this scheduled as able.  Please establish with a primary care provider for recheck and management of persistent symptoms.    ED Prescriptions    Medication Sig Dispense Auth. Provider   meloxicam (MOBIC) 7.5 MG tablet Take 1 tablet (7.5 mg total) by mouth daily. 30 tablet Augusto Gamble B, NP   cyclobenzaprine (FLEXERIL) 5 MG tablet Take 1 tablet (5 mg total) by mouth at bedtime. 15 tablet Zigmund Gottron, NP     Controlled Substance Prescriptions [place] Controlled Substance Registry consulted? Not Applicable   Zigmund Gottron, NP 02/15/19 1046

## 2019-02-15 NOTE — Discharge Instructions (Signed)
Limit heavy lifting as able, see exercises provided.  Meloxicam daily, don't take additional ibuprofen or aleve.  Flexeril at night. May cause drowsiness. Please do not take if driving or drinking alcohol.   I do recommend physical therapy if this is something you will be able to do, you will be called to get this scheduled as able.  Please establish with a primary care provider for recheck and management of persistent symptoms.

## 2019-02-21 ENCOUNTER — Ambulatory Visit (HOSPITAL_COMMUNITY)
Admission: EM | Admit: 2019-02-21 | Discharge: 2019-02-21 | Disposition: A | Discharge status: Home / Self Care | Payer: Managed Care, Other (non HMO) | Attending: Family Medicine | Admitting: Family Medicine

## 2019-02-21 ENCOUNTER — Encounter (HOSPITAL_COMMUNITY): Payer: Self-pay

## 2019-02-21 ENCOUNTER — Other Ambulatory Visit: Payer: Self-pay

## 2019-02-21 DIAGNOSIS — M545 Low back pain, unspecified: Secondary | ICD-10-CM

## 2019-02-21 DIAGNOSIS — L03311 Cellulitis of abdominal wall: Secondary | ICD-10-CM | POA: Diagnosis not present

## 2019-02-21 MED ORDER — CYCLOBENZAPRINE HCL 5 MG PO TABS: 5.0000 mg | ORAL_TABLET | Freq: Two times a day (BID) | ORAL | 0 refills | Status: DC

## 2019-02-21 MED ORDER — MELOXICAM 7.5 MG PO TABS: 7.5000 mg | ORAL_TABLET | Freq: Every day | ORAL | 0 refills | Status: DC

## 2019-02-21 MED ORDER — DOXYCYCLINE HYCLATE 100 MG PO TABS: 100.0000 mg | ORAL_TABLET | Freq: Two times a day (BID) | ORAL | 0 refills | Status: DC

## 2019-02-21 MED ORDER — MUPIROCIN 2 % EX OINT: 1.0000 "application " | TOPICAL_OINTMENT | Freq: Three times a day (TID) | CUTANEOUS | 1 refills | Status: DC

## 2019-02-21 NOTE — ED Triage Notes (Signed)
Pt presents with left side hip pain for over a week.

## 2019-02-21 NOTE — ED Provider Notes (Signed)
[facility]    CSN: [id number] Arrival date & time: 02/21/19  1509     History   Chief Complaint Chief Complaint  Patient presents with  . Hip Pain    Left    HPI Colton Flores is a 33 y.o. male.   33 yo established [facility] patient  Note from 02/16/2019 Colton Flores presents with complaints of right sided low back pain which has worsened over the past 4 days. Worse with bending. Has had similar in the past, was seen for this 6/8. Had improved, especially when he was off of work for a few days. In returning to work pain returned. He lifts a lot, works at [employer]. No numbness tingling or weakness. No loss of bladder or bowel. No saddle paresthesia. Has tried taking aleve which has helped some. No radiation of pain. No specific injury. Doesn't have a PCP.   Situation today: Two days ago, fellow employee grazed his left flank with fork of forklift and he has been nursing the abrasion since, with worsening discomfort.  The right low back pain is better with the meds.        Past Medical History:  Diagnosis Date  . Allergy     Patient Active Problem List   Diagnosis Date Noted  . Wrist pain 04/14/2011  . Screening cholesterol level 04/14/2011  . Exposure to viral disease 11/17/2010  . SINUSITIS 06/29/2010  . 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 KNEE SURGERY         Home Medications    Prior to Admission medications   Medication Sig Start Date End Date Taking? Authorizing Provider  cyclobenzaprine (FLEXERIL) 5 MG tablet Take 1 tablet (5 mg total) by mouth 2 (two) times a day. 02/21/19   Elvina SidleLauenstein, Janae Bonser, MD  doxycycline (VIBRA-TABS) 100 MG tablet Take 1 tablet (100 mg total) by mouth 2 (two) times daily. 02/21/19   Elvina SidleLauenstein, Ladarrious Kirksey, MD  ibuprofen (ADVIL) 800 MG tablet Take 1 tablet (800 mg total) by mouth 3 (three) times daily. 01/23/19   Wieters, Hallie C, PA-C  meloxicam (MOBIC) 7.5 MG tablet Take 1  tablet (7.5 mg total) by mouth daily. 02/21/19   Elvina SidleLauenstein, Neriyah Cercone, MD  mupirocin ointment (BACTROBAN) 2 % Apply 1 application topically 3 (three) times daily. 02/21/19   Elvina SidleLauenstein, Sue Fernicola, MD    Family History Family History  Problem Relation Age of Onset  . Hypertension Mother   . Diabetes Father   . Hypertension Father   . Hyperlipidemia Father     Social History Social History   Tobacco Use  . Smoking status: Former Smoker    Types: Cigarettes  . Smokeless tobacco: Never Used  Substance Use Topics  . Alcohol use: Yes  . Drug use: No     Allergies   Shellfish allergy and Iodine   Review of Systems Review of Systems   Physical Exam Triage Vital Signs ED Triage Vitals  Enc Vitals Group     BP 02/21/19 1555 131/85     Pulse Rate 02/21/19 1555 61     Resp 02/21/19 1555 17     Temp 02/21/19 1555 98.1 F (36.7 C)     Temp Source 02/21/19 1555 Oral     SpO2 02/21/19 1555 97 %     Weight --      Height --      Head Circumference --      Peak Flow --  Pain Score 02/21/19 1556 7     Pain Loc --      Pain Edu? --      Excl. in [place]? --    No data found.  Updated Vital Signs BP 131/85 (BP Location: Right Arm)   Pulse 61   Temp 98.1 F (36.7 C) (Oral)   Resp 17   SpO2 97%    Physical Exam Vitals signs and nursing note reviewed.  Constitutional:      Appearance: Normal appearance. He is obese.  HENT:     Head: Normocephalic.  Eyes:     Conjunctiva/sclera: Conjunctivae normal.  Neck:     Musculoskeletal: Normal range of motion and neck supple.  Cardiovascular:     Rate and Rhythm: Normal rate.  Pulmonary:     Effort: Pulmonary effort is normal.  Musculoskeletal: Normal range of motion.  Skin:    General: Skin is warm and dry.     Findings: Erythema present.  Neurological:     General: No focal deficit present.     Mental Status: He is alert and oriented to person, place, and time.  Psychiatric:        Mood and Affect: Mood normal.         UC Treatments / Results  Labs (all labs ordered are listed, but only abnormal results are displayed) Labs Reviewed - No data to display  EKG   Radiology No results found.  Procedures Procedures (including critical care time)  Medications Ordered in UC Medications - No data to display  Initial Impression / Assessment and Plan / UC Course  I have reviewed the triage vital signs and the nursing notes.  Pertinent labs & imaging results that were available during my care of the patient were reviewed by me and considered in my medical decision making (see chart for details).    Final Clinical Impressions(s) / UC Diagnoses   Final diagnoses:  Cellulitis of abdominal wall  Acute right-sided low back pain without sciatica     Discharge Instructions     Wash the skin on the left hip with soap and water twice a day (gently)    ED Prescriptions    Medication Sig Dispense Auth. Provider   doxycycline (VIBRA-TABS) 100 MG tablet Take 1 tablet (100 mg total) by mouth 2 (two) times daily. 20 tablet Robyn Haber, MD   mupirocin ointment (BACTROBAN) 2 % Apply 1 application topically 3 (three) times daily. 22 g Robyn Haber, MD   cyclobenzaprine (FLEXERIL) 5 MG tablet Take 1 tablet (5 mg total) by mouth 2 (two) times a day. 15 tablet Robyn Haber, MD   meloxicam (MOBIC) 7.5 MG tablet Take 1 tablet (7.5 mg total) by mouth daily. 30 tablet Robyn Haber, MD     Controlled Substance Prescriptions [place] Controlled Substance Registry consulted? Not Applicable   Robyn Haber, MD 02/21/19 [phone]

## 2019-02-21 NOTE — Discharge Instructions (Addendum)
Wash the skin on the left hip with soap and water twice a day (gently)

## 2019-03-01 ENCOUNTER — Other Ambulatory Visit: Payer: Self-pay

## 2019-03-01 ENCOUNTER — Encounter (HOSPITAL_COMMUNITY): Payer: Self-pay

## 2019-03-01 ENCOUNTER — Ambulatory Visit (HOSPITAL_COMMUNITY)
Admission: EM | Admit: 2019-03-01 | Discharge: 2019-03-01 | Disposition: A | Discharge status: Home / Self Care | Payer: Managed Care, Other (non HMO) | Attending: Family Medicine | Admitting: Family Medicine

## 2019-03-01 DIAGNOSIS — Z7689 Persons encountering health services in other specified circumstances: Secondary | ICD-10-CM

## 2019-03-01 DIAGNOSIS — Z0289 Encounter for other administrative examinations: Secondary | ICD-10-CM

## 2019-03-01 NOTE — ED Provider Notes (Signed)
[facility]    CSN: [id number] Arrival date & time: 03/01/19  1014     History   Chief Complaint Chief Complaint  Patient presents with  . Work Note    HPI Colton Flores is a 33 y.o. male.   Patient is a 33 year old male that is otherwise healthy.  He presents today with one episode of vomiting this a.m.  This was after working out in the heat and drinking water too fast.  The problem occurred 1 time.  He is now otherwise feeling okay.  Denies any current nausea, vomiting, abdominal pain, diarrhea, fevers.  Work sent here for evaluation and clearance to come back to work.  No recent traveling or known sick contacts.  ROS per HPI      Past Medical History:  Diagnosis Date  . Allergy     Patient Active Problem List   Diagnosis Date Noted  . Wrist pain 04/14/2011  . Screening cholesterol level 04/14/2011  . Exposure to viral disease 11/17/2010  . SINUSITIS 06/29/2010  . 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 KNEE SURGERY         Home Medications    Prior to Admission medications   Medication Sig Start Date End Date Taking? Authorizing Provider  cyclobenzaprine (FLEXERIL) 5 MG tablet Take 1 tablet (5 mg total) by mouth 2 (two) times a day. 02/21/19   Robyn Haber, MD  ibuprofen (ADVIL) 800 MG tablet Take 1 tablet (800 mg total) by mouth 3 (three) times daily. 01/23/19   Wieters, Hallie C, PA-C  meloxicam (MOBIC) 7.5 MG tablet Take 1 tablet (7.5 mg total) by mouth daily. 02/21/19   Robyn Haber, MD  mupirocin ointment (BACTROBAN) 2 % Apply 1 application topically 3 (three) times daily. 02/21/19   Robyn Haber, MD    Family History Family History  Problem Relation Age of Onset  . Hypertension Mother   . Diabetes Father   . Hypertension Father   . Hyperlipidemia Father     Social History Social History   Tobacco Use  . Smoking status: Former Smoker    Types: Cigarettes  . Smokeless  tobacco: Never Used  Substance Use Topics  . Alcohol use: Yes  . Drug use: No     Allergies   Shellfish allergy and Iodine   Review of Systems Review of Systems   Physical Exam Triage Vital Signs ED Triage Vitals  Enc Vitals Group     BP 03/01/19 1118 (!) 151/85     Pulse Rate 03/01/19 1118 60     Resp 03/01/19 1118 17     Temp 03/01/19 1118 97.9 F (36.6 C)     Temp Source 03/01/19 1118 Oral     SpO2 03/01/19 1118 96 %     Weight --      Height --      Head Circumference --      Peak Flow --      Pain Score 03/01/19 1119 0     Pain Loc --      Pain Edu? --      Excl. in [place]? --    No data found.  Updated Vital Signs BP (!) 151/85 (BP Location: Left Arm)   Pulse 60   Temp 97.9 F (36.6 C) (Oral)   Resp 17   SpO2 96%   Visual Acuity Right Eye Distance:   Left Eye Distance:   Bilateral Distance:  Right Eye Near:   Left Eye Near:    Bilateral Near:     Physical Exam Vitals signs and nursing note reviewed.  Constitutional:      Appearance: Normal appearance.  HENT:     Head: Normocephalic and atraumatic.     Nose: Nose normal.  Eyes:     Conjunctiva/sclera: Conjunctivae normal.  Neck:     Musculoskeletal: Normal range of motion.  Pulmonary:     Effort: Pulmonary effort is normal.  Abdominal:     Palpations: Abdomen is soft.     Tenderness: There is no abdominal tenderness.  Musculoskeletal: Normal range of motion.  Skin:    General: Skin is warm and dry.  Neurological:     Mental Status: He is alert.  Psychiatric:        Mood and Affect: Mood normal.      UC Treatments / Results  Labs (all labs ordered are listed, but only abnormal results are displayed) Labs Reviewed - No data to display  EKG   Radiology No results found.  Procedures Procedures (including critical care time)  Medications Ordered in UC Medications - No data to display  Initial Impression / Assessment and Plan / UC Course  I have reviewed the triage vital  signs and the nursing notes.  Pertinent labs & imaging results that were available during my care of the patient were reviewed by me and considered in my medical decision making (see chart for details).     Return to work-although patient is safe to return to work.  This is most likely an isolated incident and not related to any contagious illness. Work note given to return today. Final Clinical Impressions(s) / UC Diagnoses   Final diagnoses:  Return to work evaluation     Discharge Instructions     I do not believe you have any kind of contagious illness.  It is safe for you to return to work I believe the vomiting was due to drinking water too fast and heat    ED Prescriptions    None     Controlled Substance Prescriptions [place] Controlled Substance Registry consulted? Not Applicable   Janace ArisBast, Carlee Vonderhaar A, NP 03/01/19 1134

## 2019-03-01 NOTE — Discharge Instructions (Addendum)
I do not believe you have any kind of contagious illness.  It is safe for you to return to work I believe the vomiting was due to drinking water too fast and heat

## 2019-03-01 NOTE — ED Triage Notes (Signed)
Pt presents for a note to return to work after he vomited once this morning; Pt believes it was just from heat where he works, pt has no complaints of abdominal pain or any other episodes of nausea or vomiting today.

## 2019-03-08 ENCOUNTER — Other Ambulatory Visit: Payer: Self-pay

## 2019-03-08 ENCOUNTER — Ambulatory Visit (HOSPITAL_COMMUNITY)
Admission: EM | Admit: 2019-03-08 | Discharge: 2019-03-08 | Disposition: A | Discharge status: Home / Self Care | Payer: Managed Care, Other (non HMO) | Attending: Physician Assistant | Admitting: Physician Assistant

## 2019-03-08 ENCOUNTER — Encounter (HOSPITAL_COMMUNITY): Payer: Self-pay

## 2019-03-08 DIAGNOSIS — L509 Urticaria, unspecified: Secondary | ICD-10-CM | POA: Diagnosis not present

## 2019-03-08 DIAGNOSIS — K2900 Acute gastritis without bleeding: Secondary | ICD-10-CM | POA: Diagnosis not present

## 2019-03-08 MED ORDER — TRIAMCINOLONE ACETONIDE 0.1 % EX CREA: 1.0000 "application " | TOPICAL_CREAM | Freq: Two times a day (BID) | CUTANEOUS | 0 refills | Status: AC

## 2019-03-08 MED ORDER — ONDANSETRON HCL 4 MG PO TABS: 4.0000 mg | ORAL_TABLET | Freq: Four times a day (QID) | ORAL | 0 refills | Status: DC

## 2019-03-08 NOTE — Discharge Instructions (Signed)
Use cream twice daily x 7 days as directed Take zofran as needed for nausea Drink plenty of water, get plenty of rest.

## 2019-03-08 NOTE — ED Provider Notes (Signed)
[facility]    CSN: [id number] Arrival date & time: 03/08/19  1024      History   Chief Complaint Chief Complaint  Patient presents with  . Abdominal Pain  . Urticaria    HPI Isaac Blisssaiah C Washinton is a 33 y.o. male.   Patient here concerned with abdominal pain x 1 day.  Woke up with pain.  Pain located epigastric without radiation, cramping in character.  Admits nausea, denies vomiting, diarrhea, constipation.  No advil or tylenol today.   Also c/w hives x 5 days b/l forearms.  He denies new foods, soaps, detergents, denies contact with plants.  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  Denies coughing, wheezing, SOB, swelling of lips or tongue.     Past Medical History:  Diagnosis Date  . Allergy     Patient Active Problem List   Diagnosis Date Noted  . Wrist pain 04/14/2011  . Screening cholesterol level 04/14/2011  . Exposure to viral disease 11/17/2010  . SINUSITIS 06/29/2010  . 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 KNEE SURGERY         Home Medications    Prior to Admission medications   Medication Sig Start Date End Date Taking? Authorizing Provider  cyclobenzaprine (FLEXERIL) 5 MG tablet Take 1 tablet (5 mg total) by mouth 2 (two) times a day. 02/21/19  Yes Elvina SidleLauenstein, Kurt, MD  ibuprofen (ADVIL) 800 MG tablet Take 1 tablet (800 mg total) by mouth 3 (three) times daily. 01/23/19  Yes Wieters, Hallie C, PA-C  meloxicam (MOBIC) 7.5 MG tablet Take 1 tablet (7.5 mg total) by mouth daily. 02/21/19  Yes Elvina SidleLauenstein, Kurt, MD  mupirocin ointment (BACTROBAN) 2 % Apply 1 application topically 3 (three) times daily. 02/21/19   Elvina SidleLauenstein, Kurt, MD  ondansetron (ZOFRAN) 4 MG tablet Take 1 tablet (4 mg total) by mouth every 6 (six) hours. 03/08/19   Evern CoreLindquist, Ameera Tigue, PA-C  triamcinolone cream (KENALOG) 0.1 % Apply 1  application topically 2 (two) times daily for 7 days. 03/08/19 03/15/19  Evern CoreLindquist, Gwendalynn Eckstrom, PA-C    Family History Family History  Problem Relation Age of Onset  . Hypertension Mother   . Diabetes Father   . Hypertension Father   . Hyperlipidemia Father     Social History Social History   Tobacco Use  . Smoking status: Former Smoker    Types: Cigarettes  . Smokeless tobacco: Never Used  Substance Use Topics  . Alcohol use: Yes  . Drug use: No     Allergies   Shellfish allergy and Iodine   Review of Systems Review of Systems  Constitutional: Positive for appetite change. Negative for activity change, chills, diaphoresis, fever and unexpected weight change.  HENT: Negative for congestion, facial swelling, mouth sores, rhinorrhea, sinus pain, sneezing and sore throat.   Respiratory: Negative for cough, choking, chest tightness, shortness of breath and wheezing.   Cardiovascular: Negative for chest pain.  Gastrointestinal: Positive for abdominal pain (epigastric) and nausea. Negative for abdominal distention, blood in stool, constipation, diarrhea and vomiting.  Genitourinary: Negative for difficulty urinating, discharge, dysuria, penile pain, scrotal swelling and testicular pain.  Musculoskeletal: Negative for arthralgias and myalgias.  Skin: Positive for rash. Negative for color change and pallor.  Allergic/Immunologic: Positive for food allergies (shellfish). Negative for environmental allergies.  Neurological: Negative for dizziness, speech difficulty, light-headedness and  headaches.  Psychiatric/Behavioral: Negative for agitation and sleep disturbance.     Physical Exam Triage Vital Signs ED Triage Vitals [03/08/19 1048]  Enc Vitals Group     BP (!) 140/91     Pulse Rate 84     Resp 17     Temp 98.4 F (36.9 C)     Temp src      SpO2 96 %     Weight      Height      Head Circumference      Peak Flow      Pain Score 3     Pain Loc      Pain Edu?      Excl.  in [place]?    No data found.  Updated Vital Signs BP (!) 140/91   Pulse 84   Temp 98.4 F (36.9 C)   Resp 17   SpO2 96%   Visual Acuity Right Eye Distance:   Left Eye Distance:   Bilateral Distance:    Right Eye Near:   Left Eye Near:    Bilateral Near:     Physical Exam Vitals signs and nursing note reviewed.  Constitutional:      General: He is not in acute distress.    Appearance: He is well-developed and normal weight. He is not ill-appearing or toxic-appearing.  HENT:     Head: Normocephalic and atraumatic.     Mouth/Throat:     Mouth: Mucous membranes are moist.     Pharynx: Uvula midline. No pharyngeal swelling or uvula swelling.     Comments: No swelling noted Eyes:     General: No scleral icterus.    Extraocular Movements: Extraocular movements intact.     Conjunctiva/sclera: Conjunctivae normal.  Neck:     Musculoskeletal: Neck supple.  Cardiovascular:     Rate and Rhythm: Normal rate and regular rhythm.     Heart sounds: Normal heart sounds. No murmur. No gallop.   Pulmonary:     Effort: Pulmonary effort is normal. No tachypnea, accessory muscle usage, respiratory distress or retractions.     Breath sounds: Normal breath sounds. No decreased air movement. No decreased breath sounds, wheezing or rhonchi.  Abdominal:     General: Abdomen is flat. Bowel sounds are normal. There is no distension.     Palpations: Abdomen is soft. There is no shifting dullness, hepatomegaly, splenomegaly or mass.     Tenderness: There is abdominal tenderness in the epigastric area. There is no right CVA tenderness, left CVA tenderness, guarding or rebound. Negative signs include Rovsing's sign, McBurney's sign, psoas sign and obturator sign.     Hernia: No hernia is present.  Skin:    General: Skin is warm and dry.     Coloration: Skin is not jaundiced or pale.     Findings: Rash present. Rash is urticarial.       Neurological:     Mental Status: He is alert.      UC  Treatments / Results  Labs (all labs ordered are listed, but only abnormal results are displayed) Labs Reviewed - No data to display  EKG   Radiology No results found.  Procedures Procedures (including critical care time)  Medications Ordered in UC Medications - No data to display  Initial Impression / Assessment and Plan / UC Course  I have reviewed the triage vital signs and the nursing notes.  Pertinent labs & imaging results that were available during my care of the patient were  reviewed by me and considered in my medical decision making (see chart for details).     Abdominal pain likely due to gastritis.  Take zofran every 6 to 8 hours as needed. Drink clear liquids, advance diet as tolerated. Use triamcinolone cream on arms twice daily. Go to ER if you develop any worsening symptoms, including wheezing, coughing, trouble breathing, swelling of lips/tongue. Final Clinical Impressions(s) / UC Diagnoses   Final diagnoses:  Acute gastritis without hemorrhage, unspecified gastritis type  Hives     Discharge Instructions     Use cream twice daily x 7 days as directed Take zofran as needed for nausea Drink plenty of water, get plenty of rest.   ED Prescriptions    Medication Sig Dispense Auth. Provider   triamcinolone cream (KENALOG) 0.1 % Apply 1 application topically 2 (two) times daily for 7 days. 80 g Peri Jefferson, PA-C   ondansetron (ZOFRAN) 4 MG tablet Take 1 tablet (4 mg total) by mouth every 6 (six) hours. 12 tablet Peri Jefferson, PA-C     Controlled Substance Prescriptions [place] Controlled Substance Registry consulted? Not Applicable   Peri Jefferson, Hershal Coria 03/08/19 1119

## 2019-03-08 NOTE — ED Triage Notes (Signed)
Pt presents with complaints of abdominal pain and nausea x 1 day. Denies any concerns for covid-19.  Pt also states he has hives on his arms x 1 week.

## 2019-03-16 ENCOUNTER — Ambulatory Visit (HOSPITAL_COMMUNITY)
Admission: EM | Admit: 2019-03-16 | Discharge: 2019-03-16 | Disposition: A | Discharge status: Home / Self Care | Payer: Managed Care, Other (non HMO) | Attending: Family Medicine | Admitting: Family Medicine

## 2019-03-16 ENCOUNTER — Other Ambulatory Visit: Payer: Self-pay

## 2019-03-16 ENCOUNTER — Encounter (HOSPITAL_COMMUNITY): Payer: Self-pay

## 2019-03-16 DIAGNOSIS — M5441 Lumbago with sciatica, right side: Secondary | ICD-10-CM | POA: Diagnosis not present

## 2019-03-16 MED ORDER — PREDNISONE 20 MG PO TABS: ORAL_TABLET | ORAL | 0 refills | Status: DC

## 2019-03-16 MED ORDER — CYCLOBENZAPRINE HCL 5 MG PO TABS: 5.0000 mg | ORAL_TABLET | Freq: Three times a day (TID) | ORAL | 0 refills | Status: DC | PRN

## 2019-03-16 NOTE — ED Provider Notes (Signed)
[facility]    CSN: [id number] Arrival date & time: 03/16/19  1046      History   Chief Complaint Chief Complaint  Patient presents with  . Back Pain    HPI Colton Flores is a 33 y.o. male.   HPI Back Pain: Patient presents for presents evaluation of low back problems ongoing since last week 02/21/19. He work at [employer] and picked up a box last night and noticed a sharp pain right side of his back which radiated to right side of his buttocks. Symptoms have been present for more than 1 month. He has taken muscle relaxers and Meloxicam without significant relief of pain. No prior treatment with prednisone. Pain is exacerbated with walking and changing positions. Denies any known prior injury.   Past Medical History:  Diagnosis Date  . Allergy     Patient Active Problem List   Diagnosis Date Noted  . Wrist pain 04/14/2011  . Screening cholesterol level 04/14/2011  . Exposure to viral disease 11/17/2010  . SINUSITIS 06/29/2010  . 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 KNEE SURGERY        Home Medications    Prior to Admission medications   Medication Sig Start Date End Date Taking? Authorizing Provider  cyclobenzaprine (FLEXERIL) 5 MG tablet Take 1 tablet (5 mg total) by mouth 2 (two) times a day. 02/21/19  Yes Elvina SidleLauenstein, Kurt, MD  ibuprofen (ADVIL) 800 MG tablet Take 1 tablet (800 mg total) by mouth 3 (three) times daily. 01/23/19   Wieters, Hallie C, PA-C  meloxicam (MOBIC) 7.5 MG tablet Take 1 tablet (7.5 mg total) by mouth daily. 02/21/19   Elvina SidleLauenstein, Kurt, MD  mupirocin ointment (BACTROBAN) 2 % Apply 1 application topically 3 (three) times daily. 02/21/19   Elvina SidleLauenstein, Kurt, MD  ondansetron (ZOFRAN) 4 MG tablet Take 1 tablet (4 mg total) by mouth every 6 (six) hours. 03/08/19   Evern CoreLindquist, John, PA-C    Family History Family History  Problem Relation Age of Onset  . Hypertension Mother   . Diabetes  Father   . Hypertension Father   . Hyperlipidemia Father     Social History Social History   Tobacco Use  . Smoking status: Former Smoker    Types: Cigarettes  . Smokeless tobacco: Never Used  Substance Use Topics  . Alcohol use: Yes    Comment: socially  . Drug use: No     Allergies   Shellfish allergy and Iodine   Review of Systems Review of Systems   Physical Exam Triage Vital Signs ED Triage Vitals  Enc Vitals Group     BP 03/16/19 1111 136/84     Pulse Rate 03/16/19 1111 72     Resp 03/16/19 1111 17     Temp 03/16/19 1111 97.7 F (36.5 C)     Temp Source 03/16/19 1111 Tympanic     SpO2 03/16/19 1111 98 %     Weight --      Height --      Head Circumference --      Peak Flow --      Pain Score 03/16/19 1123 6     Pain Loc --      Pain Edu? --      Excl. in [place]? --    No data found.  Updated Vital Signs BP 136/84 (BP Location: Left Arm)   Pulse 72   Temp 97.7 F (36.5 C) (Tympanic)  Resp 17   SpO2 98%   Visual Acuity Right Eye Distance:   Left Eye Distance:   Bilateral Distance:    Right Eye Near:   Left Eye Near:    Bilateral Near:     Physical Exam General appearance: alert, well developed, well nourished, cooperative and in no distress Head: Normocephalic, without obvious abnormality, atraumatic Respiratory: Respirations even and unlabored, normal respiratory rate Heart: rate and rhythm normal. No gallop or murmurs noted on exam  Musculoskeletal: No gross deformities.   Skin: Skin color, texture, turgor normal. No rashes seen  Psych: Appropriate mood and affect. Neurologic: Mental status: Alert, oriented to person, place, and time, thought content appropriate. UC Treatments / Results  Labs (all labs ordered are listed, but only abnormal results are displayed) Labs Reviewed - No data to display  EKG   Radiology No results found.  Procedures Procedures (including critical care time)  Medications Ordered in UC Medications -  No data to display  Initial Impression / Assessment and Plan / UC Course  I have reviewed the triage vital signs and the nursing notes.  Pertinent labs & imaging results that were available during my care of the patient were reviewed by me and considered in my medical decision making (see chart for details).   Acute right sided sciatica without any concerning symptoms. Will continue cyclobenzaprine. Trial a prednisone taper. Work note provided. Red flags discussed. Patient verbalized understanding and agreement with plan.  Final Clinical Impressions(s) / UC Diagnoses   Final diagnoses:  Acute right-sided back pain with sciatica     Discharge Instructions     Take Prednisone 20 mg,  in mornings with breakfast as follows:  Take 3 pills for 3 days, Take 2 pills for 3 days, and Take 1 pill for 3 days.  Complete all medication.     ED Prescriptions    Medication Sig Dispense Auth. Provider   predniSONE (DELTASONE) 20 MG tablet Take 3 PO QAM x3days, 2 PO QAM x3days, 1 PO QAM x3days 18 tablet Scot Jun, FNP   cyclobenzaprine (FLEXERIL) 5 MG tablet Take 1 tablet (5 mg total) by mouth 3 (three) times daily as needed for muscle spasms. 10 tablet Scot Jun, FNP     Controlled Substance Prescriptions [place] Controlled Substance Registry consulted? Not Applicable   Scot Jun, FNP 03/17/19 [phone]

## 2019-03-16 NOTE — Discharge Instructions (Addendum)
Take Prednisone 20 mg,  in mornings with breakfast as follows:  Take 3 pills for 3 days, Take 2 pills for 3 days, and Take 1 pill for 3 days.   Complete all medication.  

## 2019-03-16 NOTE — ED Triage Notes (Signed)
Patient presents to Urgent Care with complaints of lower back pain since last night while at work. Patient reports he works in a [employer], will not be [employer] for workers' comp.

## 2019-03-28 ENCOUNTER — Other Ambulatory Visit: Payer: Self-pay

## 2019-03-28 ENCOUNTER — Encounter (HOSPITAL_COMMUNITY): Payer: Self-pay

## 2019-03-28 ENCOUNTER — Ambulatory Visit (HOSPITAL_COMMUNITY)
Admission: EM | Admit: 2019-03-28 | Discharge: 2019-03-28 | Disposition: A | Discharge status: Home / Self Care | Payer: Managed Care, Other (non HMO) | Attending: Internal Medicine | Admitting: Internal Medicine

## 2019-03-28 DIAGNOSIS — S39012D Strain of muscle, fascia and tendon of lower back, subsequent encounter: Secondary | ICD-10-CM | POA: Diagnosis not present

## 2019-03-28 MED ORDER — IBUPROFEN 800 MG PO TABS: 800.0000 mg | ORAL_TABLET | Freq: Three times a day (TID) | ORAL | 0 refills | Status: DC

## 2019-03-28 MED ORDER — CYCLOBENZAPRINE HCL 10 MG PO TABS: 10.0000 mg | ORAL_TABLET | Freq: Three times a day (TID) | ORAL | 0 refills | Status: DC | PRN

## 2019-03-28 NOTE — ED Provider Notes (Signed)
[facility]    CSN: [id number] Arrival date & time: 03/28/19  [id number]      History   Chief Complaint Chief Complaint  Patient presents with  . Back Pain    HPI Colton Flores is a 33 y.o. male with no past medical history comes to urgent care with complaints of low back pain of 2 days duration.  Symptoms started a couple of days ago and has been persistent.  Pain is of moderate severity.  Is worsened by bending or moving and at rest the back feels tight.  No known relieving factors.  Patient has not tried over-the-counter medications.  He has been seen here a few months ago for something similar.  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  No urinary or bowel continence problems.   HPI  Past Medical History:  Diagnosis Date  . Allergy     Patient Active Problem List   Diagnosis Date Noted  . Wrist pain 04/14/2011  . Screening cholesterol level 04/14/2011  . Exposure to viral disease 11/17/2010  . SINUSITIS 06/29/2010  . 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 KNEE SURGERY         Home Medications    Prior to Admission medications   Medication Sig Start Date End Date Taking? Authorizing Provider  cyclobenzaprine (FLEXERIL) 10 MG tablet Take 1 tablet (10 mg total) by mouth 3 (three) times daily as needed for muscle spasms. 03/28/19   Merrilee JanskyLamptey,  O, MD  ibuprofen (ADVIL) 800 MG tablet Take 1 tablet (800 mg total) by mouth 3 (three) times daily. 03/28/19   LampteyBritta Mccreedy,  O, MD    Family History Family History  Problem Relation Age of Onset  . Hypertension Mother   . Diabetes Father   . Hypertension Father   . Hyperlipidemia Father     Social History Social History   Tobacco Use  . Smoking status: Former Smoker    Types: Cigarettes  . Smokeless tobacco: Never Used  Substance Use Topics  . Alcohol use: Yes    Comment: socially  . Drug use: No     Allergies   Shellfish  allergy and Iodine   Review of Systems Review of Systems  Constitutional: Positive for activity change. Negative for chills, fatigue and fever.  HENT: Negative.   Respiratory: Negative.   Gastrointestinal: Negative.   Genitourinary: Negative.   Musculoskeletal: Positive for back pain. Negative for arthralgias, joint swelling, myalgias, neck pain and neck stiffness.  Skin: Negative.   Neurological: Negative.   Psychiatric/Behavioral: Negative.      Physical Exam Triage Vital Signs ED Triage Vitals  Enc Vitals Group     BP 03/28/19 0947 (!) 150/79     Pulse Rate 03/28/19 0947 85     Resp 03/28/19 0947 18     Temp 03/28/19 0947 98.3 F (36.8 C)     Temp Source 03/28/19 0947 Oral     SpO2 03/28/19 0947 97 %     Weight 03/28/19 0946 210 lb (95.3 kg)     Height --      Head Circumference --      Peak Flow --      Pain Score 03/28/19 0946 6     Pain Loc --      Pain Edu? --      Excl. in [place]? --    No data found.  Updated Vital Signs BP Marland Kitchen(!)  150/79 (BP Location: Right Arm)   Pulse 85   Temp 98.3 F (36.8 C) (Oral)   Resp 18   Wt 95.3 kg   SpO2 97%   BMI 32.89 kg/m   Visual Acuity Right Eye Distance:   Left Eye Distance:   Bilateral Distance:    Right Eye Near:   Left Eye Near:    Bilateral Near:     Physical Exam Vitals signs and nursing note reviewed.  Constitutional:      Appearance: He is not ill-appearing, toxic-appearing or diaphoretic.  Cardiovascular:     Rate and Rhythm: Normal rate and regular rhythm.     Pulses: Normal pulses.     Heart sounds: Normal heart sounds. No murmur. No friction rub.  Pulmonary:     Effort: Pulmonary effort is normal. No respiratory distress.     Breath sounds: Normal breath sounds. No wheezing or rhonchi.  Abdominal:     General: Bowel sounds are normal.     Palpations: Abdomen is soft.  Musculoskeletal: Normal range of motion.        General: No swelling or deformity.  Skin:    General: Skin is warm.      Capillary Refill: Capillary refill takes less than 2 seconds.     Coloration: Skin is not jaundiced.     Findings: No bruising or erythema.  Neurological:     General: No focal deficit present.     Mental Status: He is alert and oriented to person, place, and time.     Cranial Nerves: No cranial nerve deficit.     Sensory: No sensory deficit.     Motor: No weakness.      UC Treatments / Results  Labs (all labs ordered are listed, but only abnormal results are displayed) Labs Reviewed - No data to display  EKG   Radiology No results found.  Procedures Procedures (including critical care time)  Medications Ordered in UC Medications - No data to display  Initial Impression / Assessment and Plan / UC Course  I have reviewed the triage vital signs and the nursing notes.  Pertinent labs & imaging results that were available during my care of the patient were reviewed by me and considered in my medical decision making (see chart for details).     1.  Low back pain, recurrence (musculoskeletal): Back strengthening exercises Back injury prevention techniques were discussed Ibuprofen 800 mg as needed for back pain Flexeril 10 mg 3 times daily as needed for spasms Warm compresses over the back for 3 to 5 minutes at a time with the rest period of 15 minutes. Patient got a work excuse for couple days.  He was requesting work excuse for 7 days. If patient's symptoms worsen he is advised to return to urgent care to be reevaluated Final Clinical Impressions(s) / UC Diagnoses   Final diagnoses:  None   Discharge Instructions   None    ED Prescriptions    Medication Sig Dispense Auth. Provider   cyclobenzaprine (FLEXERIL) 10 MG tablet Take 1 tablet (10 mg total) by mouth 3 (three) times daily as needed for muscle spasms. 30 tablet , Myrene Galas, MD   ibuprofen (ADVIL) 800 MG tablet Take 1 tablet (800 mg total) by mouth 3 (three) times daily. 30 tablet , Myrene Galas, MD      Controlled Substance Prescriptions [place] Controlled Substance Registry consulted? No   Chase Picket, MD 03/28/19 1034

## 2019-03-28 NOTE — ED Triage Notes (Signed)
Pt states he has lower back pain. Pt states he was moving boxes and now he has lower back pain x 2 days.

## 2019-04-10 ENCOUNTER — Ambulatory Visit (HOSPITAL_COMMUNITY)
Admission: EM | Admit: 2019-04-10 | Discharge: 2019-04-10 | Disposition: A | Discharge status: Home / Self Care | Payer: Managed Care, Other (non HMO) | Attending: Family Medicine | Admitting: Family Medicine

## 2019-04-10 ENCOUNTER — Other Ambulatory Visit: Payer: Self-pay

## 2019-04-10 ENCOUNTER — Encounter (HOSPITAL_COMMUNITY): Payer: Self-pay

## 2019-04-10 DIAGNOSIS — M5441 Lumbago with sciatica, right side: Secondary | ICD-10-CM | POA: Diagnosis not present

## 2019-04-10 MED ORDER — MELOXICAM 15 MG PO TABS: 15.0000 mg | ORAL_TABLET | Freq: Every day | ORAL | 0 refills | Status: DC

## 2019-04-10 NOTE — ED Provider Notes (Signed)
[facility]    CSN: [id number] Arrival date & time: 04/10/19  2297      History   Chief Complaint Chief Complaint  Patient presents with  . Back Pain    HPI Colton Flores is a 33 y.o. male.   Colton Flores presents with complaints of low back pain with radiation down right leg. This has been ongoing for him for the past 2 months, but seems to have worsened since 9/11. 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 Lifting worsens the pain. At rest pain improves, currently 6/10. No saddle symptoms, no bowel or bladder incontinence. Has been taking 800mg  ibuprofen 5x a day which hasn't helped, as well as flexeril twice a day. Doesn't have a PCP. Sometimes his right foot will tingle or feel numb. Pain is worse to right side, but does feel pain to entire low back.     ROS per HPI, negative if not otherwise mentioned.      Past Medical History:  Diagnosis Date  . Allergy     Patient Active Problem List   Diagnosis Date Noted  . Wrist pain 04/14/2011  . Screening cholesterol level 04/14/2011  . Exposure to viral disease 11/17/2010  . SINUSITIS 06/29/2010  . 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 KNEE SURGERY         Home Medications    Prior to Admission medications   Medication Sig Start Date End Date Taking? Authorizing Provider  cyclobenzaprine (FLEXERIL) 10 MG tablet Take 1 tablet (10 mg total) by mouth 3 (three) times daily as needed for muscle spasms. 03/28/19   LampteyMyrene Galas, MD  meloxicam (MOBIC) 15 MG tablet Take 1 tablet (15 mg total) by mouth daily. 04/10/19   Zigmund Gottron, NP    Family History Family History  Problem Relation Age of Onset  . Hypertension Mother   . Diabetes Father   . Hypertension Father   . Hyperlipidemia Father     Social History Social History   Tobacco Use  . Smoking status: Former Smoker    Types: Cigarettes   . Smokeless tobacco: Never Used  Substance Use Topics  . Alcohol use: Yes    Comment: socially  . Drug use: No     Allergies   Shellfish allergy and Iodine   Review of Systems Review of Systems   Physical Exam Triage Vital Signs ED Triage Vitals  Enc Vitals Group     BP 04/10/19 0919 133/82     Pulse Rate 04/10/19 0919 75     Resp 04/10/19 0919 18     Temp 04/10/19 0919 98.4 F (36.9 C)     Temp Source 04/10/19 0919 Oral     SpO2 04/10/19 0919 98 %     Weight --      Height --      Head Circumference --      Peak Flow --      Pain Score 04/10/19 0917 8     Pain Loc --      Pain Edu? --      Excl. in [place]? --    No data found.  Updated Vital Signs BP 133/82 (BP Location: Right Arm)   Pulse 75   Temp 98.4 F (36.9 C) (Oral)   Resp 18   SpO2 98%   Visual Acuity Right Eye Distance:   Left Eye Distance:  Bilateral Distance:    Right Eye Near:   Left Eye Near:    Bilateral Near:     Physical Exam Constitutional:      Appearance: He is well-developed.  Cardiovascular:     Rate and Rhythm: Normal rate.  Pulmonary:     Effort: Pulmonary effort is normal.  Musculoskeletal:     Lumbar back: He exhibits tenderness, bony tenderness and pain. He exhibits normal range of motion, no swelling, no edema, no deformity, no laceration, no spasm and normal pulse.     Comments: Radiating pain with right leg raise; tenderness to lumbar spine with right side musculature worse than left; strength equal bilaterally; gross sensation intact to lower extremities   Skin:    General: Skin is warm and dry.  Neurological:     Mental Status: He is alert and oriented to person, place, and time.      UC Treatments / Results  Labs (all labs ordered are listed, but only abnormal results are displayed) Labs Reviewed - No data to display  EKG   Radiology No results found.  Procedures Procedures (including critical care time)  Medications Ordered in UC Medications - No  data to display  Initial Impression / Assessment and Plan / UC Course  I have reviewed the triage vital signs and the nursing notes.  Pertinent labs & imaging results that were available during my care of the patient were reviewed by me and considered in my medical decision making (see chart for details).     Persistent low back pain with right sided sciatica. Patient has continued to have to work and lift repetitively, however. Will switch to meloxicam. Has flexeril for PRN use. Encouraged follow up with PCP as may benefit from physical therapy or further evaluation if symptoms persist. Patient verbalized understanding and agreeable to plan.  Ambulatory out of clinic without difficulty.    Final Clinical Impressions(s) / UC Diagnoses   Final diagnoses:  Acute bilateral low back pain with right-sided sciatica     Discharge Instructions     Light and regular activity as tolerated.  See exercises provided.  Heat application while active can help with muscle spasms.  Sleep with pillow under your knees.   Meloxicam once a day, take with food. Don't take additional ibuprofen. Flexeril as needed for spasms.  Please establish with a primary care provider as needed for persistent symptoms as may need further evaluation and    ED Prescriptions    Medication Sig Dispense Auth. Provider   meloxicam (MOBIC) 15 MG tablet Take 1 tablet (15 mg total) by mouth daily. 20 tablet Georgetta HaberBurky,  B, NP     Controlled Substance Prescriptions [place] Controlled Substance Registry consulted? Not Applicable   Georgetta HaberBurky,  B, NP 04/10/19 [phone]

## 2019-04-10 NOTE — ED Triage Notes (Signed)
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

## 2019-04-10 NOTE — Discharge Instructions (Addendum)
Light and regular activity as tolerated.  See exercises provided.  Heat application while active can help with muscle spasms.  Sleep with pillow under your knees.   Meloxicam once a day, take with food. Don't take additional ibuprofen. Flexeril as needed for spasms.  Please establish with a primary care provider as needed for persistent symptoms as may need further evaluation and

## 2019-04-13 ENCOUNTER — Encounter (HOSPITAL_COMMUNITY): Payer: Self-pay | Admitting: Emergency Medicine

## 2019-04-13 ENCOUNTER — Other Ambulatory Visit: Payer: Self-pay

## 2019-04-13 ENCOUNTER — Ambulatory Visit (HOSPITAL_COMMUNITY)
Admission: EM | Admit: 2019-04-13 | Discharge: 2019-04-13 | Disposition: A | Discharge status: Home / Self Care | Payer: Managed Care, Other (non HMO) | Attending: Family Medicine | Admitting: Family Medicine

## 2019-04-13 DIAGNOSIS — R1111 Vomiting without nausea: Secondary | ICD-10-CM

## 2019-04-13 NOTE — ED Triage Notes (Signed)
Pt sts vomiting x 1 early this am at work after drinking 2 Gatorade too quickly; pt denies sx at present; pt needs note to return to work

## 2019-04-13 NOTE — ED Provider Notes (Signed)
[facility]    CSN: [id number] Arrival date & time: 04/13/19  1039      History   Chief Complaint Chief Complaint  Patient presents with  . Emesis  . Letter for School/Work    HPI Colton Flores is a 33 y.o. male.   He is presenting with emesis.  This occurred early this morning when he was at work.  He drank some water and he thinks he drank it to fasten vomited it up.  He denies any further episodes of emesis.  Denies any abdominal pain.  No recent illnesses or fevers.  No travel.  Denies any constipation or diarrhea.  He works at the [employer]  HPI  Past Medical History:  Diagnosis Date  . Allergy     Patient Active Problem List   Diagnosis Date Noted  . Wrist pain 04/14/2011  . Screening cholesterol level 04/14/2011  . Exposure to viral disease 11/17/2010  . SINUSITIS 06/29/2010  . 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 KNEE SURGERY         Home Medications    Prior to Admission medications   Medication Sig Start Date End Date Taking? Authorizing Provider  cyclobenzaprine (FLEXERIL) 10 MG tablet Take 1 tablet (10 mg total) by mouth 3 (three) times daily as needed for muscle spasms. 03/28/19   LampteyBritta Mccreedy, Philip O, MD  meloxicam (MOBIC) 15 MG tablet Take 1 tablet (15 mg total) by mouth daily. 04/10/19   Georgetta HaberBurky, Natalie B, NP    Family History Family History  Problem Relation Age of Onset  . Hypertension Mother   . Diabetes Father   . Hypertension Father   . Hyperlipidemia Father     Social History Social History   Tobacco Use  . Smoking status: Former Smoker    Types: Cigarettes  . Smokeless tobacco: Never Used  Substance Use Topics  . Alcohol use: Yes    Comment: socially  . Drug use: No     Allergies   Shellfish allergy and Iodine   Review of Systems Review of Systems  Constitutional: Negative for fever.  HENT: Negative for congestion.   Respiratory:  Negative for cough.   Cardiovascular: Negative for chest pain.  Gastrointestinal: Positive for vomiting. Negative for abdominal pain.  Musculoskeletal: Negative for gait problem.  Skin: Negative for color change.  Neurological: Negative for weakness.  Hematological: Negative for adenopathy.     Physical Exam Triage Vital Signs ED Triage Vitals  Enc Vitals Group     BP 04/13/19 1106 (!) 150/83     Pulse Rate 04/13/19 1106 78     Resp 04/13/19 1106 18     Temp 04/13/19 1106 98.4 F (36.9 C)     Temp Source 04/13/19 1106 Oral     SpO2 04/13/19 1106 100 %     Weight --      Height --      Head Circumference --      Peak Flow --      Pain Score 04/13/19 1107 0     Pain Loc --      Pain Edu? --      Excl. in [place]? --    No data found.  Updated Vital Signs BP (!) 150/83 (BP Location: Right Arm)   Pulse 78   Temp 98.4 F (36.9 C) (Oral)   Resp 18   SpO2 100%   Visual Acuity Right Eye Distance:  Left Eye Distance:   Bilateral Distance:    Right Eye Near:   Left Eye Near:    Bilateral Near:     Physical Exam Gen: NAD, alert, cooperative with exam, well-appearing ENT: normal lips, normal nasal mucosa,  Eye: normal EOM, normal conjunctiva and lids CV:  no edema, +2 pedal pulses   Resp: no accessory muscle use, non-labored,  GI: no masses or tenderness, no hernia, soft, NTND, +BS  Skin: no rashes, no areas of induration  Neuro: normal tone, normal sensation to touch Psych:  normal insight, alert and oriented MSK: normal gait, normal strength   UC Treatments / Results  Labs (all labs ordered are listed, but only abnormal results are displayed) Labs Reviewed - No data to display  EKG   Radiology No results found.  Procedures Procedures (including critical care time)  Medications Ordered in UC Medications - No data to display  Initial Impression / Assessment and Plan / UC Course  I have reviewed the triage vital signs and the nursing notes.  Pertinent  labs & imaging results that were available during my care of the patient were reviewed by me and considered in my medical decision making (see chart for details).     Colton Flores is a 33 yo M that is presenting with emesis.  This occurred just one time earlier today.  He feels it is attributed to drinking too much water too fast.  He denies any further episodes.  Denies any abdominal pain.  Denies any fevers or chills.  He feels well and back to his normal self.  Provided with work note.  Counseled on supportive care.  Given indications return to follow-up.  Final Clinical Impressions(s) / UC Diagnoses   Final diagnoses:  Non-intractable vomiting without nausea, unspecified vomiting type     Discharge Instructions     Please follow up if your symptoms fail to improve.     ED Prescriptions    None     PDMP not reviewed this encounter.   Rosemarie Ax, MD 04/13/19 [phone]

## 2019-04-13 NOTE — Discharge Instructions (Addendum)
Please follow up if your symptoms fail to improve.  

## 2019-04-19 ENCOUNTER — Ambulatory Visit (HOSPITAL_COMMUNITY)
Admission: EM | Admit: 2019-04-19 | Discharge: 2019-04-19 | Disposition: A | Discharge status: Home / Self Care | Payer: Managed Care, Other (non HMO) | Attending: Emergency Medicine | Admitting: Emergency Medicine

## 2019-04-19 ENCOUNTER — Encounter (HOSPITAL_COMMUNITY): Payer: Self-pay

## 2019-04-19 ENCOUNTER — Other Ambulatory Visit: Payer: Self-pay

## 2019-04-19 DIAGNOSIS — R110 Nausea: Secondary | ICD-10-CM

## 2019-04-19 DIAGNOSIS — R030 Elevated blood-pressure reading, without diagnosis of hypertension: Secondary | ICD-10-CM | POA: Diagnosis not present

## 2019-04-19 DIAGNOSIS — R1084 Generalized abdominal pain: Secondary | ICD-10-CM | POA: Diagnosis not present

## 2019-04-19 MED ORDER — ONDANSETRON HCL 4 MG PO TABS: 4.0000 mg | ORAL_TABLET | Freq: Four times a day (QID) | ORAL | 0 refills | Status: DC

## 2019-04-19 NOTE — ED Triage Notes (Signed)
Patient reports having abdominal pain and vomiting episodes after eating solid food for 2 days.

## 2019-04-19 NOTE — Discharge Instructions (Signed)
Take the antinausea medication ondansetron as prescribed.    Go to the emergency department if you have acute worsening abdominal pain.  Or if you develop vomiting, diarrhea, fever, chills, or other concerns.    Your blood pressure is elevated today at 134/104.  Please have this rechecked by your primary care provider in 2 weeks.  If you do not have a primary care provider, one is suggested below.

## 2019-04-19 NOTE — ED Provider Notes (Signed)
[place]    CSN: [id number] Arrival date & time: 04/19/19  1053      History   Chief Complaint Chief Complaint  Patient presents with  . Abdominal Pain  . Emesis    HPI Colton Flores is a 33 y.o. male.   Patient presents with abdominal pain and vomiting 2 days ago.  No emesis today or yesterday.  He reports ongoing nausea and generalized abdominal pain.  He denies fever, chills, diarrhea, constipation, dysuria, or other symptoms.  The history is provided by the patient.    Past Medical History:  Diagnosis Date  . Allergy     Patient Active Problem List   Diagnosis Date Noted  . Wrist pain 04/14/2011  . Screening cholesterol level 04/14/2011  . Exposure to viral disease 11/17/2010  . SINUSITIS 06/29/2010  . 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 KNEE SURGERY         Home Medications    Prior to Admission medications   Medication Sig Start Date End Date Taking? Authorizing Provider  cyclobenzaprine (FLEXERIL) 10 MG tablet Take 1 tablet (10 mg total) by mouth 3 (three) times daily as needed for muscle spasms. 03/28/19   LampteyMyrene Galas, MD  meloxicam (MOBIC) 15 MG tablet Take 1 tablet (15 mg total) by mouth daily. 04/10/19   Zigmund Gottron, NP  ondansetron (ZOFRAN) 4 MG tablet Take 1 tablet (4 mg total) by mouth every 6 (six) hours. 04/19/19   Sharion Balloon, NP    Family History Family History  Problem Relation Age of Onset  . Hypertension Mother   . Diabetes Father   . Hypertension Father   . Hyperlipidemia Father     Social History Social History   Tobacco Use  . Smoking status: Former Smoker    Types: Cigarettes  . Smokeless tobacco: Never Used  Substance Use Topics  . Alcohol use: Yes    Comment: socially  . Drug use: No     Allergies   Shellfish allergy and Iodine   Review of Systems Review of Systems  Constitutional: Negative for chills and fever.  HENT: Negative for  ear pain and sore throat.   Eyes: Negative for pain and visual disturbance.  Respiratory: Negative for cough and shortness of breath.   Cardiovascular: Negative for chest pain and palpitations.  Gastrointestinal: Positive for abdominal pain and nausea. Negative for constipation, diarrhea and vomiting.  Genitourinary: Negative for dysuria and hematuria.  Musculoskeletal: Negative for arthralgias and back pain.  Skin: Negative for color change and rash.  Neurological: Negative for seizures and syncope.  All other systems reviewed and are negative.    Physical Exam Triage Vital Signs ED Triage Vitals  Enc Vitals Group     BP 04/19/19 1134 (!) 134/104     Pulse Rate 04/19/19 1134 61     Resp 04/19/19 1134 17     Temp 04/19/19 1134 98.7 F (37.1 C)     Temp Source 04/19/19 1134 Oral     SpO2 04/19/19 1134 98 %     Weight --      Height --      Head Circumference --      Peak Flow --      Pain Score 04/19/19 1131 8     Pain Loc --      Pain Edu? --      Excl. in [place]? --    No data  found.  Updated Vital Signs BP (!) 134/104 (BP Location: Left Arm)   Pulse 61   Temp 98.7 F (37.1 C) (Oral)   Resp 17   SpO2 98%   Visual Acuity Right Eye Distance:   Left Eye Distance:   Bilateral Distance:    Right Eye Near:   Left Eye Near:    Bilateral Near:     Physical Exam Vitals signs and nursing note reviewed.  Constitutional:      General: He is not in acute distress.    Appearance: He is well-developed. He is not ill-appearing.  HENT:     Head: Normocephalic and atraumatic.     Mouth/Throat:     Mouth: Mucous membranes are moist.     Pharynx: Oropharynx is clear.  Eyes:     Conjunctiva/sclera: Conjunctivae normal.  Neck:     Musculoskeletal: Neck supple.  Cardiovascular:     Rate and Rhythm: Normal rate and regular rhythm.     Heart sounds: No murmur.  Pulmonary:     Effort: Pulmonary effort is normal. No respiratory distress.     Breath sounds: Normal breath  sounds.  Abdominal:     General: Bowel sounds are normal.     Palpations: Abdomen is soft.     Tenderness: There is no abdominal tenderness. There is no right CVA tenderness, left CVA tenderness, guarding or rebound.  Skin:    General: Skin is warm and dry.     Findings: No rash.  Neurological:     General: No focal deficit present.     Mental Status: He is alert and oriented to person, place, and time.      UC Treatments / Results  Labs (all labs ordered are listed, but only abnormal results are displayed) Labs Reviewed - No data to display  EKG   Radiology No results found.  Procedures Procedures (including critical care time)  Medications Ordered in UC Medications - No data to display  Initial Impression / Assessment and Plan / UC Course  I have reviewed the triage vital signs and the nursing notes.  Pertinent labs & imaging results that were available during my care of the patient were reviewed by me and considered in my medical decision making (see chart for details).    Nausea, generalized abdominal pain, elevated blood pressure.  Patient is well-appearing and his exam is unremarkable.  Treating with Zofran.  Instructed patient to go to the emergency department if he develops acute worsening abdominal pain or return of his emesis, or new symptoms such as diarrhea, fever, chills, or other concerns.  Discussed that his blood pressure is elevated and he needs to have this rechecked by his PCP in the next 2 to 4 weeks.  Discussed with patient that he needs to establish a primary care provider to follow his ongoing medical issues and his elevated blood pressure.  Patient agrees with plan of care.     Final Clinical Impressions(s) / UC Diagnoses   Final diagnoses:  Nausea without vomiting  Generalized abdominal pain  Elevated blood-pressure reading without diagnosis of hypertension     Discharge Instructions     Take the antinausea medication ondansetron as  prescribed.    Go to the emergency department if you have acute worsening abdominal pain.  Or if you develop vomiting, diarrhea, fever, chills, or other concerns.    Your blood pressure is elevated today at 134/104.  Please have this rechecked by your primary care provider in 2 weeks.  If you do not have a primary care provider, one is suggested below.       ED Prescriptions    Medication Sig Dispense Auth. Provider   ondansetron (ZOFRAN) 4 MG tablet Take 1 tablet (4 mg total) by mouth every 6 (six) hours. 12 tablet Mickie Bailate, Natalea Sutliff H, NP     PDMP not reviewed this encounter.   Mickie Bailate, Ajdin Macke H, NP 04/19/19 [phone]

## 2019-05-31 ENCOUNTER — Other Ambulatory Visit: Payer: Self-pay

## 2019-05-31 ENCOUNTER — Encounter (HOSPITAL_COMMUNITY): Payer: Self-pay

## 2019-05-31 ENCOUNTER — Ambulatory Visit (HOSPITAL_COMMUNITY)
Admission: EM | Admit: 2019-05-31 | Discharge: 2019-05-31 | Disposition: A | Discharge status: Home / Self Care | Payer: Managed Care, Other (non HMO) | Attending: Family Medicine | Admitting: Family Medicine

## 2019-05-31 DIAGNOSIS — M5431 Sciatica, right side: Secondary | ICD-10-CM | POA: Diagnosis not present

## 2019-05-31 DIAGNOSIS — M5441 Lumbago with sciatica, right side: Secondary | ICD-10-CM

## 2019-05-31 MED ORDER — HYDROCODONE-ACETAMINOPHEN 7.5-325 MG PO TABS: 1.0000 | ORAL_TABLET | Freq: Four times a day (QID) | ORAL | 0 refills | Status: DC | PRN

## 2019-05-31 MED ORDER — TIZANIDINE HCL 4 MG PO TABS: 4.0000 mg | ORAL_TABLET | Freq: Four times a day (QID) | ORAL | 1 refills | Status: DC | PRN

## 2019-05-31 MED ORDER — IBUPROFEN 800 MG PO TABS: 800.0000 mg | ORAL_TABLET | Freq: Three times a day (TID) | ORAL | 0 refills | Status: DC | PRN

## 2019-05-31 NOTE — ED Provider Notes (Signed)
[facility]    CSN: [id number] Arrival date & time: 05/31/19  1704      History   Chief Complaint Chief Complaint  Patient presents with  . Back Pain  . Hip Pain    HPI Colton Flores is a 33 y.o. male.   HPI  Patient works in a [employer].  He is a Administrator, [employer].  He does a lot of of lifting pushing pulling twisting activities.  He has recurring low back pain.  He often gets pain that goes through his right buttock down to his right leg.  He has been seen here in the ER for this.  He has not had x-rays.  He is always treated conservatively.  He states he always responds to treatment in a few days of rest.  Has not had any accident, injury, or fall.  He thinks the pain is from repetitive lifting. No family history of back disorders.   Past Medical History:  Diagnosis Date  . Allergy     Patient Active Problem List   Diagnosis Date Noted  . Wrist pain 04/14/2011  . Screening cholesterol level 04/14/2011  . Exposure to viral disease 11/17/2010  . SINUSITIS 06/29/2010  . 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 KNEE SURGERY         Home Medications    Prior to Admission medications   Medication Sig Start Date End Date Taking? Authorizing Provider  HYDROcodone-acetaminophen (NORCO) 7.5-325 MG tablet Take 1 tablet by mouth every 6 (six) hours as needed for moderate pain. 05/31/19   Eustace Moore, MD  ibuprofen (ADVIL) 800 MG tablet Take 1 tablet (800 mg total) by mouth every 8 (eight) hours as needed for moderate pain. 05/31/19   Eustace Moore, MD  tiZANidine (ZANAFLEX) 4 MG tablet Take 1-2 tablets (4-8 mg total) by mouth every 6 (six) hours as needed for muscle spasms. 05/31/19   Eustace Moore, MD    Family History Family History  Problem Relation Age of Onset  . Hypertension Mother   . Diabetes Father   . Hypertension Father   . Hyperlipidemia Father     Social History Social History    Tobacco Use  . Smoking status: Former Smoker    Types: Cigarettes  . Smokeless tobacco: Never Used  Substance Use Topics  . Alcohol use: Yes    Comment: socially  . Drug use: No     Allergies   Shellfish allergy and Iodine   Review of Systems Review of Systems  Constitutional: Negative for chills and fever.  HENT: Negative for ear pain and sore throat.   Eyes: Negative for pain and visual disturbance.  Respiratory: Negative for cough and shortness of breath.   Cardiovascular: Negative for chest pain and palpitations.  Gastrointestinal: Negative for abdominal pain and vomiting.  Genitourinary: Negative for dysuria and hematuria.  Musculoskeletal: Positive for back pain. Negative for arthralgias.  Skin: Negative for color change and rash.  Neurological: Negative for seizures and syncope.  All other systems reviewed and are negative.    Physical Exam Triage Vital Signs ED Triage Vitals  Enc Vitals Group     BP 05/31/19 1752 (!) 146/73     Pulse Rate 05/31/19 1752 90     Resp 05/31/19 1752 16     Temp 05/31/19 1752 98.2 F (36.8 C)     Temp Source 05/31/19 1752 Oral     SpO2 05/31/19 1752  98 %     Weight --      Height --      Head Circumference --      Peak Flow --      Pain Score 05/31/19 1749 7     Pain Loc --      Pain Edu? --      Excl. in [place]? --    No data found.  Updated Vital Signs BP (!) 146/73 (BP Location: Left Arm)   Pulse 90   Temp 98.2 F (36.8 C) (Oral)   Resp 16   SpO2 98%   Visual Acuity Right Eye Distance:   Left Eye Distance:   Bilateral Distance:    Right Eye Near:   Left Eye Near:    Bilateral Near:     Physical Exam Constitutional:      General: He is not in acute distress.    Appearance: He is well-developed. He is obese. He is not ill-appearing.     Comments: Appears mildly uncomfortable.  Guarded movements  HENT:     Head: Normocephalic and atraumatic.     Mouth/Throat:     Comments: Mask in place Eyes:      Conjunctiva/sclera: Conjunctivae normal.     Pupils: Pupils are equal, round, and reactive to light.  Neck:     Musculoskeletal: Normal range of motion and neck supple.  Cardiovascular:     Rate and Rhythm: Normal rate and regular rhythm.     Heart sounds: Normal heart sounds.  Pulmonary:     Effort: Pulmonary effort is normal. No respiratory distress.  Abdominal:     General: There is no distension.     Palpations: Abdomen is soft.  Musculoskeletal: Normal range of motion.     Comments: Patient has tenderness in the bilateral lumbar column of muscles, at the L5-S1 junction, and the right is my joint.  Range of motion is slow but full.  Strength sensation range of motion reflexes are normal in both lower extremities.  Straight leg raise is negative bilaterally.  Skin:    General: Skin is warm and dry.  Neurological:     Mental Status: He is alert.      UC Treatments / Results  Labs (all labs ordered are listed, but only abnormal results are displayed) Labs Reviewed - No data to display  EKG   Radiology No results found.  Procedures Procedures (including critical care time)  Medications Ordered in UC Medications - No data to display  Initial Impression / Assessment and Plan / UC Course  I have reviewed the triage vital signs and the nursing notes.  Pertinent labs & imaging results that were available during my care of the patient were reviewed by me and considered in my medical decision making (see chart for details).     Mechanical low back pain.  Recurring sciatica. Final Clinical Impressions(s) / UC Diagnoses   Final diagnoses:  Sciatica of right side  Acute bilateral low back pain with right-sided sciatica     Discharge Instructions     Activity as tolerated.  Avoid the temptation for bedrest Take ibuprofen 3 times a day with food.  This is for the back pain and for inflammation.  I have given you enough to last a while Take tizanidine as needed as muscle  relaxer.  This is useful at bedtime.  I have given you a refill of this medication. Take Norco if the pain is severe or if it is keeping you  awake at night.  Take with food.  Do not drive or work while on pain medication Use ice or heat to painful muscles Off work until next week   ED Prescriptions    Medication Sig Dispense Auth. Provider   ibuprofen (ADVIL) 800 MG tablet Take 1 tablet (800 mg total) by mouth every 8 (eight) hours as needed for moderate pain. 90 tablet Eustace MooreNelson, Adil Tugwell Sue, MD   tiZANidine (ZANAFLEX) 4 MG tablet Take 1-2 tablets (4-8 mg total) by mouth every 6 (six) hours as needed for muscle spasms. 21 tablet Eustace MooreNelson, Allix Blomquist Sue, MD   HYDROcodone-acetaminophen [facility](NORCO) 7.5-325 MG tablet Take 1 tablet by mouth every 6 (six) hours as needed for moderate pain. 15 tablet Eustace MooreNelson, Morissa Obeirne Sue, MD     I have reviewed the PDMP during this encounter.   Eustace MooreNelson, Elsia Lasota Sue, MD 05/31/19 [phone]

## 2019-05-31 NOTE — ED Triage Notes (Signed)
Pt states 2 days ago he was bend over and he felt a sharp pinch in his back. Pt states he started having hip pain. Pt states having numbness in his right foot x 1 day.

## 2019-05-31 NOTE — Discharge Instructions (Signed)
Activity as tolerated.  Avoid the temptation for bedrest Take ibuprofen 3 times a day with food.  This is for the back pain and for inflammation.  I have given you enough to last a while Take tizanidine as needed as muscle relaxer.  This is useful at bedtime.  I have given you a refill of this medication. Take Norco if the pain is severe or if it is keeping you awake at night.  Take with food.  Do not drive or work while on pain medication Use ice or heat to painful muscles Off work until next week

## 2019-06-05 ENCOUNTER — Other Ambulatory Visit: Payer: Self-pay

## 2019-06-05 ENCOUNTER — Encounter (HOSPITAL_COMMUNITY): Payer: Self-pay | Admitting: Emergency Medicine

## 2019-06-05 ENCOUNTER — Emergency Department (HOSPITAL_COMMUNITY)
Admission: EM | Admit: 2019-06-05 | Discharge: 2019-06-06 | Disposition: A | Discharge status: Home / Self Care | Payer: Managed Care, Other (non HMO) | Attending: Emergency Medicine | Admitting: Emergency Medicine

## 2019-06-05 DIAGNOSIS — Z79899 Other long term (current) drug therapy: Secondary | ICD-10-CM | POA: Insufficient documentation

## 2019-06-05 DIAGNOSIS — M5441 Lumbago with sciatica, right side: Secondary | ICD-10-CM | POA: Diagnosis not present

## 2019-06-05 DIAGNOSIS — M5489 Other dorsalgia: Secondary | ICD-10-CM | POA: Diagnosis present

## 2019-06-05 DIAGNOSIS — Z87891 Personal history of nicotine dependence: Secondary | ICD-10-CM | POA: Insufficient documentation

## 2019-06-05 DIAGNOSIS — G8929 Other chronic pain: Secondary | ICD-10-CM

## 2019-06-05 NOTE — ED Triage Notes (Signed)
Pt reports lower back pain since September from a fall at work that is getting worse. Pt reports it was lower left back but now pt is starting to feel lower right side as well. No new injuries. Increased pain with movement. Denies neuro symptoms, tingling.

## 2019-06-06 MED ORDER — TIZANIDINE HCL 4 MG PO TABS: 4.0000 mg | ORAL_TABLET | Freq: Four times a day (QID) | ORAL | 1 refills | Status: DC | PRN

## 2019-06-06 MED ORDER — IBUPROFEN 800 MG PO TABS: 800.0000 mg | ORAL_TABLET | Freq: Three times a day (TID) | ORAL | 0 refills | Status: DC | PRN

## 2019-06-06 NOTE — ED Provider Notes (Signed)
[facility] Provider Note   CSN: [id number] Arrival date & time: 06/05/19  2314     History   Chief Complaint Chief Complaint  Patient presents with  . Back Pain    HPI HARVEST STANCO is a 33 y.o. male.     Patient to ED with persistent low back pain that radiates to the right lower extremity. Symptoms started in July of this year and have been persistent. No numbness, weakness, bowel/bladder dysfunction. He has been seen several times in Urgent Care settings and prescribed pain medication and muscle relaxers. 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 No new injury. No abdominal pain.  The history is provided by the patient. No language interpreter was used.  Back Pain Associated symptoms: no abdominal pain, no fever, no numbness and no weakness     Past Medical History:  Diagnosis Date  . Allergy     Patient Active Problem List   Diagnosis Date Noted  . Wrist pain 04/14/2011  . Screening cholesterol level 04/14/2011  . Exposure to viral disease 11/17/2010  . SINUSITIS 06/29/2010  . 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 KNEE SURGERY          Home Medications    Prior to Admission medications   Medication Sig Start Date End Date Taking? Authorizing Provider  HYDROcodone-acetaminophen (NORCO) 7.5-325 MG tablet Take 1 tablet by mouth every 6 (six) hours as needed for moderate pain. 05/31/19   Raylene Everts, MD  ibuprofen (ADVIL) 800 MG tablet Take 1 tablet (800 mg total) by mouth every 8 (eight) hours as needed for moderate pain. 05/31/19   Raylene Everts, MD  tiZANidine (ZANAFLEX) 4 MG tablet Take 1-2 tablets (4-8 mg total) by mouth every 6 (six) hours as needed for muscle spasms. 05/31/19   Raylene Everts, MD    Family History Family History  Problem Relation Age of Onset  . Hypertension Mother   . Diabetes Father   .  Hypertension Father   . Hyperlipidemia Father     Social History Social History   Tobacco Use  . Smoking status: Former Smoker    Types: Cigarettes  . Smokeless tobacco: Never Used  Substance Use Topics  . Alcohol use: Yes    Comment: socially  . Drug use: No     Allergies   Shellfish allergy and Iodine   Review of Systems Review of Systems  Constitutional: Negative for fever.  Gastrointestinal: Negative.  Negative for abdominal pain.  Genitourinary: Negative for enuresis.  Musculoskeletal: Positive for back pain.       See HPI.  Skin: Negative.   Neurological: Negative.  Negative for weakness and numbness.     Physical Exam Updated Vital Signs BP (!) 151/102 (BP Location: Right Arm)   Pulse 71   Temp 98.5 F (36.9 C) (Oral)   Resp 18   Ht 5\' 7"  (1.702 m)   Wt 99.3 kg   SpO2 98%   BMI 34.30 kg/m   Physical Exam Constitutional:      Appearance: He is well-developed.  Neck:     Musculoskeletal: Normal range of motion.  Pulmonary:     Effort: Pulmonary effort is normal.  Musculoskeletal: Normal range of motion.     Comments: Midline and right lumbar tenderness without swelling. Full symmetric strength of bilateral LE's.   Skin:    General: Skin is warm  and dry.  Neurological:     Mental Status: He is alert and oriented to person, place, and time.     Sensory: No sensory deficit.     Deep Tendon Reflexes: Reflexes normal.      ED Treatments / Results  Labs (all labs ordered are listed, but only abnormal results are displayed) Labs Reviewed - No data to display  EKG None  Radiology No results found.  Procedures Procedures (including critical care time)  Medications Ordered in ED Medications - No data to display   Initial Impression / Assessment and Plan / ED Course  I have reviewed the triage vital signs and the nursing notes.  Pertinent labs & imaging results that were available during my care of the patient were reviewed by me and  considered in my medical decision making (see chart for details).        Patient to ED with continuous back pain radiating to right LE x 5 months. No neurologic red flags by history or exam.   Discussed reasons for ongoing pain in regard to nerve irritation either by muscle inflammation or disc protrusion. Reassured the patient no concerning acute findings are present. Discussed the need for ortho follow up for definitive work up and treatment of right sciatica.  Final Clinical Impressions(s) / ED Diagnoses   Final diagnoses:  None   1. Low back pain 2. Right sciatica  ED Discharge Orders    None       Elpidio Anis, Cordelia Poche 06/06/19 9622    Ward, Layla Maw, DO 06/06/19 [phone]

## 2019-06-11 ENCOUNTER — Encounter: Payer: Self-pay | Admitting: Family Medicine

## 2019-06-11 ENCOUNTER — Ambulatory Visit (INDEPENDENT_AMBULATORY_CARE_PROVIDER_SITE_OTHER): Payer: Managed Care, Other (non HMO) | Admitting: Family Medicine

## 2019-06-11 ENCOUNTER — Other Ambulatory Visit: Payer: Self-pay

## 2019-06-11 DIAGNOSIS — M5441 Lumbago with sciatica, right side: Secondary | ICD-10-CM

## 2019-06-11 MED ORDER — CELECOXIB 200 MG PO CAPS: 200.0000 mg | ORAL_CAPSULE | Freq: Two times a day (BID) | ORAL | 6 refills | Status: DC | PRN

## 2019-06-11 MED ORDER — GABAPENTIN 100 MG PO CAPS: ORAL_CAPSULE | ORAL | 3 refills | Status: DC

## 2019-06-11 NOTE — Progress Notes (Signed)
I saw and examined the patient with Dr. Mayer Masker and agree with assessment and plan as outlined.    Low back and right leg pain 2 months status post fall at work.  Symptoms and exam consistent with right-sided sciatica.  We will try physical therapy at [facility].  Light duty work for the next 4 weeks.  X-rays and MRI scan if he fails to improve.

## 2019-06-11 NOTE — Progress Notes (Signed)
Colton Flores - 33 y.o. male MRN [id number]  Date of birth: [birth year]  Office Visit Note: Visit Date: 06/11/2019 PCP: Patient, No Pcp Per Referred by: No ref. provider found  Subjective: Chief Complaint  Patient presents with  . Lower Back - Pain    Pain on right side of lower back since 04/10/19 - fell at work. Pain radiates down the right leg. Numbness/tingling in the right foot. Starting to feel pain on the left side of his lower back now.   HPI: Colton Flores is a 33 y.o. male who comes in today with lower back pain with sciatica down for the past right side for the past 3 months.  He reports that he has had pain since an injury on 04/10/19 where he was reshelving items and slipped on a spill on the floor, twisted an fell directly onto his back. He has pain along right side as well as in low central back. Pain is worse with bending forward. He has numbness and tingling behind right buttocks and down leg when he sits for prolonged period of time or when he is lying down. He has been given Tizanidine and Relafen which have helped the pain some.    ROS Otherwise per HPI.  Assessment & Plan: Visit Diagnoses:  1. Acute right-sided low back pain with right-sided sciatica   Likely disc protrusion with right sided sciatica. Will limit him to light duty, refer to [facility] PT. Will trial gabapentin and celebrex. If no improvement, will obtain x-rays and MRI.    Meds & Orders:  Meds ordered this encounter  Medications  . gabapentin (NEURONTIN) 100 MG capsule    Sig: 1 PO q HS, may increase to 3 PO q HS if needed    Dispense:  90 capsule    Refill:  3  . celecoxib (CELEBREX) 200 MG capsule    Sig: Take 1 capsule (200 mg total) by mouth 2 (two) times daily as needed.    Dispense:  60 capsule    Refill:  6    Orders Placed This Encounter  Procedures  . Ambulatory referral to Physical Therapy    Follow-up: PRN  Procedures: No procedures performed  No notes on file   Clinical  History: No specialty comments available.   He reports that he has quit smoking. His smoking use included cigarettes. He has never used smokeless tobacco. No results for input(s): HGBA1C, LABURIC in the last 8760 hours.  Objective:  VS:  HT:    WT:   BMI:     BP:   HR: bpm  TEMP: ( )  RESP:  Physical Exam  PHYSICAL EXAM: Gen: NAD, alert, cooperative with exam, well-appearing HEENT: clear conjunctiva,  CV:  no edema, capillary refill brisk, normal rate Resp: non-labored Skin: no rashes, normal turgor  Neuro: no gross deficits.  Psych:  alert and oriented  Ortho Exam  Lumbar spine: - Inspection: no gross deformity or asymmetry, swelling or ecchymosis - Palpation: TTP over L3-L5 spinous processes, tight paraspinal muscles. No pain over SI joints b/l - ROM: Limited lumbar spine flexion secondary to pain, full extension without pain - Strength: 5/5 strength of lower extremity in L4-S1 nerve root distributions b/l; normal gait - Neuro: sensation intact in the L4-S1 nerve root distribution b/l, 2+ L4 and S1 reflexes - Special testing: Negative straight leg raise, negative slump, negative Stork test, Negative FABER  Imaging: No results found.  Past Medical/Family/Surgical/Social History: Medications & Allergies reviewed per EMR,  new medications updated. Patient Active Problem List   Diagnosis Date Noted  . Wrist pain 04/14/2011  . Screening cholesterol level 04/14/2011  . Exposure to viral disease 11/17/2010  . SINUSITIS 06/29/2010  . 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 Allergy    Family History  Problem Relation Age of Onset  . Hypertension Mother   . Diabetes Father   . Hypertension Father   . Hyperlipidemia Father    Past Surgical History:  Procedure Laterality Date  . KNEE SURGERY     Social History   Occupational History  . Not on file  Tobacco Use  . Smoking status: Former Smoker    Types: Cigarettes  . Smokeless  tobacco: Never Used  Substance and Sexual Activity  . Alcohol use: Yes    Comment: socially  . Drug use: No  . Sexual activity: Not on file

## 2019-07-05 ENCOUNTER — Encounter: Payer: Self-pay | Admitting: Family Medicine

## 2019-07-05 ENCOUNTER — Ambulatory Visit (INDEPENDENT_AMBULATORY_CARE_PROVIDER_SITE_OTHER): Payer: Managed Care, Other (non HMO) | Admitting: Family Medicine

## 2019-07-05 ENCOUNTER — Other Ambulatory Visit: Payer: Self-pay

## 2019-07-05 DIAGNOSIS — M5441 Lumbago with sciatica, right side: Secondary | ICD-10-CM | POA: Diagnosis not present

## 2019-07-05 MED ORDER — ETODOLAC 400 MG PO TABS: 400.0000 mg | ORAL_TABLET | Freq: Two times a day (BID) | ORAL | 3 refills | Status: DC | PRN

## 2019-07-05 MED ORDER — BACLOFEN 10 MG PO TABS: 5.0000 mg | ORAL_TABLET | Freq: Three times a day (TID) | ORAL | 3 refills | Status: DC | PRN

## 2019-07-05 NOTE — Progress Notes (Signed)
   Office Visit Note   Patient: Colton Flores           Date of Birth: 1986/07/07           MRN: [id number] Visit Date: 07/05/2019 Requested by: No referring provider defined for this encounter. PCP: Patient, No Pcp Per  Subjective: Chief Complaint  Patient presents with  . Lower Back - Pain    Pain in lower back - started on the right side of the back  04/10/2019. Slipped and fell at work. Pain is starting to move across to the other side of the back. No pain down the legs. "Tight" sensation in posterior rt thigh and lt buttock.    HPI: He is about 3 months status post injury at work resulting in low back pain.  Since last visit we referred him to physical therapy but apparently his employer has not approved it.  He was referred to a different doctor who told him his pain indicated that he was getting better.  He continues on light duty restrictions, however.  Pain is still in the right lower back but also is on the left now.  He is getting occasional sciatica toward the right side.  He is using gabapentin with some relief, but anti-inflammatories do not seem to be helping.              ROS: No fevers or chills, no bowel or bladder dysfunction.  All other systems were reviewed and are negative.  Objective: Vital Signs: There were no vitals taken for this visit.  Physical Exam:  General:  Alert and oriented, in no acute distress. Pulm:  Breathing unlabored. Psy:  Normal mood, congruent affect. Skin: No visible rash. Low back: He is tender in the lower lumbar paraspinous muscles and has some tightness bilaterally.  Slightly tender in the right sciatic notch.  Straight leg raise negative, lower extremity strength and reflexes are normal.  Imaging: None today  Assessment & Plan: 1.  41-month status post injury at work with persistent low back pain -We will try referring him to physical therapy again.  Prescription was given for baclofen and Lodine to take as needed.  Follow-up in about  6 weeks.  If still no improvement, then x-rays and possibly MRI scan.     Procedures: No procedures performed  No notes on file     PMFS History: Patient Active Problem List   Diagnosis Date Noted  . Wrist pain 04/14/2011  . Screening cholesterol level 04/14/2011  . Exposure to viral disease 11/17/2010  . SINUSITIS 06/29/2010  . 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 Allergy     Family History  Problem Relation Age of Onset  . Hypertension Mother   . Diabetes Father   . Hypertension Father   . Hyperlipidemia Father     Past Surgical History:  Procedure Laterality Date  . KNEE SURGERY     Social History   Occupational History  . Not on file  Tobacco Use  . Smoking status: Former Smoker    Types: Cigarettes  . Smokeless tobacco: Never Used  Substance and Sexual Activity  . Alcohol use: Yes    Comment: socially  . Drug use: No  . Sexual activity: Not on file

## 2019-07-08 ENCOUNTER — Ambulatory Visit: Payer: Managed Care, Other (non HMO) | Attending: Family Medicine

## 2019-07-08 ENCOUNTER — Other Ambulatory Visit: Payer: Self-pay

## 2019-07-08 DIAGNOSIS — G8929 Other chronic pain: Secondary | ICD-10-CM | POA: Diagnosis present

## 2019-07-08 DIAGNOSIS — M62830 Muscle spasm of back: Secondary | ICD-10-CM | POA: Insufficient documentation

## 2019-07-08 DIAGNOSIS — M545 Low back pain, unspecified: Secondary | ICD-10-CM

## 2019-07-08 DIAGNOSIS — R293 Abnormal posture: Secondary | ICD-10-CM | POA: Diagnosis present

## 2019-07-08 NOTE — Patient Instructions (Signed)
LTR , Knee to cest, piriformis stretch 30 sec RT/LT 3-5 re[ps 2x/day    PPT x 10 2x/day 5 sec hold

## 2019-07-08 NOTE — Therapy (Signed)
[place], [place], [address] Phone: [phone]   Fax:  [phone]  Physical Therapy Evaluation  Patient Details  Name: Colton Flores MRN: [id number] Date of Birth: [birth year] Referring Provider (PT): Eunice Blase, MD   Encounter Date: 07/08/2019  PT End of Session - 07/08/19 1045    Visit Number  1    Number of Visits  12    Date for PT Re-Evaluation  08/16/19    Authorization Type  [employer]    PT Start Time  1046    PT Stop Time  1130    PT Time Calculation (min)  44 min    Activity Tolerance  Patient tolerated treatment well;Patient limited by pain    Behavior During Therapy  [facility] for tasks assessed/performed       Past Medical History:  Diagnosis Date  . Allergy     Past Surgical History:  Procedure Laterality Date  . KNEE SURGERY      There were no vitals filed for this visit.   Subjective Assessment - 07/08/19 1049    Subjective  He reports back issues. He has LBP with tighness in RT and LT side with deep pain/strain.  He has had multiple episode of LBP since June 2020 but these went away.  He thinks he was over straining back at work.  04/10/19 he fell  reaching for a box.   He has painn since then.   MD said he had back spasms.    Pertinent History  LT patella tendon repair    Limitations  Lifting;Sitting   His on light duty now   How long can you sit comfortably?  Sitting for 20 min    Diagnostic tests  Xrays :    Patient Stated Goals  He wants to have pain resolve    Currently in Pain?  Yes    Pain Score  6     Pain Location  Back    Pain Orientation  Right;Left;Lower    Pain Descriptors / Indicators  Aching    Pain Type  Chronic pain    Pain Radiating Towards  buttock and tightness in Rt hamstring    Pain Onset  More than a month ago    Pain Frequency  Constant    Aggravating Factors   sitting , lifting    Pain Relieving Factors  topicals and medication.         [facility] PT  Assessment - 07/08/19 0001      Assessment   Medical Diagnosis  lower back painl    Referring Provider (PT)  Eunice Blase, MD    Onset Date/Surgical Date  --   initial this past summer then had a fall 03/2019   Next MD Visit  08/09/19    Prior Therapy  No      Precautions   Precautions  None      Restrictions   Weight Bearing Restrictions  No      Balance Screen   Has the patient fallen in the past 6 months  Yes    How many times?  1   sliped reaching for box   Has the patient had a decrease in activity level because of a fear of falling?   Yes    Is the patient reluctant to leave their home because of a fear of falling?   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#       Cognition   Overall Cognitive Status  Within Functional Limits for tasks assessed      Observation/Other Assessments   Focus on Therapeutic Outcomes (FOTO)   52% limited    33% limited  improvement predicted      Posture/Postural Control   Posture Comments  LT lateral shift      ROM / Strength   AROM / PROM / Strength  AROM;PROM;Strength      AROM   AROM Assessment Site  Lumbar    Lumbar Flexion  50    Lumbar Extension  15   morepain   Lumbar - Right Side Bend  10   more pain   Lumbar - Left Side Bend  18      PROM   Overall PROM Comments  decr hip IR bilaterally       Strength   Overall Strength Comments  LE  WNL   abs fair to poor      Flexibility   Soft Tissue Assessment /Muscle Length  yes    Hamstrings  full ROM equal to LT but end range pain in back       Palpation   SI assessment   + distraction     Palpation comment  tender bilateral QL  and paraspinals                 Objective measurements completed on examination: See above findings.              PT Education - 07/08/19 1126    Education Details  POc    HEP    Person(s) Educated  Patient    Methods   Explanation;Demonstration;Tactile cues;Verbal cues;Handout    Comprehension  Returned demonstration;Verbalized understanding       PT Short Term Goals - 07/08/19 1205      PT SHORT TERM GOAL #1   Title  He will be independnet with initial HEP    Time  2    Period  Weeks    Status  New      PT SHORT TERM GOAL #2   Title  He will report pain decreased 10-20% in bilateral lower back    Time  3    Period  Weeks    Status  New      PT SHORT TERM GOAL #3   Title  He will  rpeort pain as intermittant    Time  3    Period  Weeks    Status  New        PT Long Term Goals - 07/08/19 1207      PT LONG TERM GOAL #1   Title  He will be independent with all hEP  issued    Time  6    Period  Weeks    Status  New      PT LONG TERM GOAL #2   Title  He will report pain overall decr 75% or more    Time  6    Period  Weeks    Status  New      PT LONG TERM GOAL #3   Title  His FOTO score will decr to 33% limited    Time  6    Period  Weeks    Status  New      PT LONG TERM GOAL #4   Title  He  will return to full duty work    Time  6    Period  Weeks    Status  New      PT LONG TERM GOAL #5   Title  He will demo understanding of good body mechanics with lifting    Time  6    Period  Weeks    Status  New             Plan - 07/08/19 1127    Clinical Impression Statement  Mr Beckford presents with chronic RT>LT back pain with some tightness into RT hamstring and some RT buttock pain. He was tender in lower back soft tissues. He has a mild lateral shift to RT. He has more pain with extension and RT sidebending in spine. He should improve with skilled PT and consistent HEP    Personal Factors and Comorbidities  Past/Current Experience;Time since onset of injury/illness/exacerbation    Examination-Activity Limitations  Sit;Carry;Lift    Examination-Participation Restrictions  Community Activity   work   Stability/Clinical Decision Making  Stable/Uncomplicated    Clinical  Decision Making  Low    Rehab Potential  Good    PT Frequency  2x / week    PT Duration  6 weeks    PT Treatment/Interventions  Passive range of motion;Dry needling;Therapeutic exercise;Therapeutic activities;Patient/family education;Moist Heat;Electrical Stimulation;Ultrasound;Manual techniques;Taping    PT Next Visit Plan  Review HEP and add as needed. Manual and modalities  for pain.    PT Home Exercise Plan  LTR. Kto C, PPT, piriformis stretching       Patient will benefit from skilled therapeutic intervention in order to improve the following deficits and impairments:  Decreased range of motion, Pain, Decreased activity tolerance, Increased muscle spasms, Postural dysfunction, Decreased strength  Visit Diagnosis: Chronic bilateral low back pain, unspecified whether sciatica present  Muscle spasm of back  Abnormal posture     Problem List Patient Active Problem List   Diagnosis Date Noted  . Wrist pain 04/14/2011  . Screening cholesterol level 04/14/2011  . Exposure to viral disease 11/17/2010  . SINUSITIS 06/29/2010  . 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 [place], [place], [address] Phone: [phone]   Fax:  [phone]  Name: KONNAR BEN MRN: [id number] Date of Birth: 02/16/1986

## 2019-07-10 ENCOUNTER — Ambulatory Visit: Payer: Managed Care, Other (non HMO) | Admitting: Physical Therapy

## 2019-07-10 ENCOUNTER — Encounter: Payer: Self-pay | Admitting: Physical Therapy

## 2019-07-10 ENCOUNTER — Other Ambulatory Visit: Payer: Self-pay

## 2019-07-10 DIAGNOSIS — M545 Low back pain: Secondary | ICD-10-CM | POA: Diagnosis not present

## 2019-07-10 DIAGNOSIS — R293 Abnormal posture: Secondary | ICD-10-CM

## 2019-07-10 DIAGNOSIS — M62830 Muscle spasm of back: Secondary | ICD-10-CM

## 2019-07-10 DIAGNOSIS — G8929 Other chronic pain: Secondary | ICD-10-CM

## 2019-07-10 NOTE — Therapy (Signed)
[place], [place], [address] Phone: [phone]   Fax:  [phone]  Physical Therapy Treatment  Patient Details  Name: Colton Flores MRN: [id number] Date of Birth: 1986-05-23 Referring Provider (PT): Eunice Blase, MD   Encounter Date: 07/10/2019  PT End of Session - 07/10/19 1022    Visit Number  2    Number of Visits  12    Date for PT Re-Evaluation  08/16/19    Authorization Type  [employer]    PT Start Time  1018    PT Stop Time  1109    PT Time Calculation (min)  51 min       Past Medical History:  Diagnosis Date  . Allergy     Past Surgical History:  Procedure Laterality Date  . KNEE SURGERY      There were no vitals filed for this visit.  Subjective Assessment - 07/10/19 1019    Subjective  Doing the stretches. Just got off work. Pain is 6/10. Pain can be 8/10 with work and a lot of bending  The stretches hurt to do them but then loosen up. No radiating pain today.    Currently in Pain?  Yes    Pain Score  6     Pain Location  Back    Pain Orientation  Lower    Pain Descriptors / Indicators  Aching;Sharp    Pain Type  Chronic pain    Aggravating Factors   bending at work    Pain Relieving Factors  topical and meds                       [facility] Adult PT Treatment/Exercise - 07/10/19 0001      Self-Care   Self-Care  Lifting    Lifting  use of dowel for alignment of spine, review on lifting via squat , instruction on deadlift.       Lumbar Exercises: Stretches   Lower Trunk Rotation  10 seconds    Lower Trunk Rotation Limitations  10 reps     Pelvic Tilt  10 reps    Figure 4 Stretch  3 reps;30 seconds    Figure 4 Stretch Limitations  push and pull       Lumbar Exercises: Aerobic   Nustep  L5 UE/LE x 5 minutes       Modalities   Modalities  Electrical Stimulation;Moist Heat      Moist Heat Therapy   Number Minutes Moist Heat  15 Minutes    Moist Heat Location   Lumbar Spine      Electrical Stimulation   Electrical Stimulation Location  Lumbar    Electrical Stimulation Action  IFC x 15 minutes    Electrical Stimulation Parameters  38mA    Electrical Stimulation Goals  Pain             PT Education - 07/10/19 1100    Education Details  HEP    Person(s) Educated  Patient    Methods  Explanation;Handout    Comprehension  Verbalized understanding       PT Short Term Goals - 07/08/19 1205      PT SHORT TERM GOAL #1   Title  He will be independnet with initial HEP    Time  2    Period  Weeks    Status  New      PT SHORT TERM GOAL #2   Title  He  will report pain decreased 10-20% in bilateral lower back    Time  3    Period  Weeks    Status  New      PT SHORT TERM GOAL #3   Title  He will  rpeort pain as intermittant    Time  3    Period  Weeks    Status  New        PT Long Term Goals - 07/08/19 1207      PT LONG TERM GOAL #1   Title  He will be independent with all hEP  issued    Time  6    Period  Weeks    Status  New      PT LONG TERM GOAL #2   Title  He will report pain overall decr 75% or more    Time  6    Period  Weeks    Status  New      PT LONG TERM GOAL #3   Title  His FOTO score will decr to 33% limited    Time  6    Period  Weeks    Status  New      PT LONG TERM GOAL #4   Title  He will return to full duty work    Time  6    Period  Weeks    Status  New      PT LONG TERM GOAL #5   Title  He will demo understanding of good body mechanics with lifting    Time  6    Period  Weeks    Status  New            Plan - 07/10/19 1100    Clinical Impression Statement  Pt arrives reporting  no pain into buttock or hamstring and indicates pain is upper to lower lumbar spine. He reports increased pain with bending at work today requiring him to take a break. Time spent with instruction on hip hinge/squat during lifting. He verbalizes that he is not using proper body mechanics. He is able to return  demonstrate proper alignement with squat for lifting in clinic today. He had increased pain with a small ROM deadlift AROM. Reviewed HEP with more tightness noted in right hip verses left. He required max cues to properly perform pelvic tilit. Advanced core strength to supine marching however he had increased pain. Able to tolerate bent knee fall outs and maintain core engagement. This was given for HEP. Ended session with trial of IFC and HMP to Lumbar spine. Will assess response to this treatment next session.    PT Next Visit Plan  Review HEP and add as needed. Manual and modalities  for pain.              assess response to IFC, did he try body mechanics with work duties?    PT Home Exercise Plan  LTR. Kto C, PPT, piriformis stretching, added Bent knee fall outs with abdominal draw in       Patient will benefit from skilled therapeutic intervention in order to improve the following deficits and impairments:  Decreased range of motion, Pain, Decreased activity tolerance, Increased muscle spasms, Postural dysfunction, Decreased strength  Visit Diagnosis: Chronic bilateral low back pain, unspecified whether sciatica present  Muscle spasm of back  Abnormal posture     Problem List Patient Active Problem List   Diagnosis Date Noted  . Wrist pain 04/14/2011  . Screening cholesterol  level 04/14/2011  . Exposure to viral disease 11/17/2010  . SINUSITIS 06/29/2010  . 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

## 2019-07-19 ENCOUNTER — Encounter

## 2019-07-23 ENCOUNTER — Ambulatory Visit: Payer: Managed Care, Other (non HMO)

## 2019-07-25 ENCOUNTER — Ambulatory Visit: Payer: Managed Care, Other (non HMO)

## 2019-07-30 ENCOUNTER — Ambulatory Visit: Payer: Managed Care, Other (non HMO) | Attending: Family Medicine

## 2019-07-30 ENCOUNTER — Other Ambulatory Visit: Payer: Self-pay

## 2019-07-30 DIAGNOSIS — G8929 Other chronic pain: Secondary | ICD-10-CM | POA: Diagnosis present

## 2019-07-30 DIAGNOSIS — M62830 Muscle spasm of back: Secondary | ICD-10-CM | POA: Diagnosis present

## 2019-07-30 DIAGNOSIS — M545 Low back pain: Secondary | ICD-10-CM | POA: Insufficient documentation

## 2019-07-30 DIAGNOSIS — R293 Abnormal posture: Secondary | ICD-10-CM | POA: Diagnosis present

## 2019-07-30 NOTE — Therapy (Signed)
[facility] [address] [place], [place], [address] Phone: [phone]   Fax:  [phone]  Physical Therapy Treatment  Patient Details  Name: Colton Flores MRN: [id number] Date of Birth: [birth year] Referring Provider (PT): Lavada Mesi, MD   Encounter Date: 07/30/2019  PT End of Session - 07/30/19 0918    Visit Number  3    Number of Visits  12    Date for PT Re-Evaluation  08/16/19    Authorization Type  [employer]    PT Start Time  1018    PT Stop Time  1110    PT Time Calculation (min)  52 min    Activity Tolerance  Patient tolerated treatment well;Patient limited by pain    Behavior During Therapy  [facility] for tasks assessed/performed       Past Medical History:  Diagnosis Date  . Allergy     Past Surgical History:  Procedure Laterality Date  . KNEE SURGERY      There were no vitals filed for this visit.  Subjective Assessment - 07/30/19 0921    Subjective  No radicular pain.  LBP continues no better. Stopped work last Tuesday.  Pain more on RT.    Pain Score  6     Pain Location  Back    Pain Orientation  Right;Left    Pain Descriptors / Indicators  Aching    Pain Type  Chronic pain    Pain Onset  More than a month ago    Pain Frequency  Constant    Aggravating Factors   edning    Pain Relieving Factors  medss                       [facility] Adult PT Treatment/Exercise - 07/30/19 0001      Lumbar Exercises: Stretches   Lower Trunk Rotation  10 seconds    Lower Trunk Rotation Limitations  10 reps     Pelvic Tilt  10 reps      Lumbar Exercises: Aerobic   Nustep  L5 UE/LE x 6 minutes       Moist Heat Therapy   Number Minutes Moist Heat  15 Minutes   on during stretching    Moist Heat Location  Lumbar Spine      Electrical Stimulation   Electrical Stimulation Location  Lumbar    Electrical Stimulation Action  IFC with heat    Electrical Stimulation Parameters     Electrical  Stimulation Goals  Pain      Manual Therapy   Manual therapy comments  PA mobs to thoracici and lumbar spine  without jump or spasm noted.  Good mobility of spine.  Stiffness of RT hip exten   ant glide with fulcrum . posterior glide to RT ilia sustained tion.   hamstring stretching RTR    knee to chest assist RT 2 min                     PT Short Term Goals - 07/08/19 1205      PT SHORT TERM GOAL #1   Title  He will be independnet with initial HEP    Time  2    Period  Weeks    Status  New      PT SHORT TERM GOAL #2   Title  He will report pain decreased 10-20% in bilateral lower back    Time  3  Period  Weeks    Status  New      PT SHORT TERM GOAL #3   Title  He will  rpeort pain as intermittant    Time  3    Period  Weeks    Status  New        PT Long Term Goals - 07/08/19 1207      PT LONG TERM GOAL #1   Title  He will be independent with all hEP  issued    Time  6    Period  Weeks    Status  New      PT LONG TERM GOAL #2   Title  He will report pain overall decr 75% or more    Time  6    Period  Weeks    Status  New      PT LONG TERM GOAL #3   Title  His FOTO score will decr to 33% limited    Time  6    Period  Weeks    Status  New      PT LONG TERM GOAL #4   Title  He will return to full duty work    Time  6    Period  Weeks    Status  New      PT LONG TERM GOAL #5   Title  He will demo understanding of good body mechanics with lifting    Time  6    Period  Weeks    Status  New            Plan - 07/30/19 6160    Clinical Impression Statement  Pt reports no better and is out of work. MRI not in our system. He will see MD next week for review of testing.  SLR and hip extension ROM improved post stretching    PT Treatment/Interventions  Passive range of motion;Dry needling;Therapeutic exercise;Therapeutic activities;Patient/family education;Moist Heat;Electrical Stimulation;Ultrasound;Manual techniques;Taping    PT Next Visit Plan   Review HEP and add as needed. Manual and modalities  for pain.              assess response to IFC,    PT Home Exercise Plan  LTR. Kto C, PPT, piriformis stretching, added Bent knee fall outs with abdominal draw in    Consulted and Agree with Plan of Care  Patient       Patient will benefit from skilled therapeutic intervention in order to improve the following deficits and impairments:  Decreased range of motion, Pain, Decreased activity tolerance, Increased muscle spasms, Postural dysfunction, Decreased strength  Visit Diagnosis: Chronic bilateral low back pain, unspecified whether sciatica present  Muscle spasm of back  Abnormal posture     Problem List Patient Active Problem List   Diagnosis Date Noted  . Wrist pain 04/14/2011  . Screening cholesterol level 04/14/2011  . Exposure to viral disease 11/17/2010  . SINUSITIS 06/29/2010  . 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

## 2019-08-01 ENCOUNTER — Ambulatory Visit: Payer: Managed Care, Other (non HMO)

## 2019-08-06 ENCOUNTER — Other Ambulatory Visit: Payer: Self-pay

## 2019-08-06 ENCOUNTER — Ambulatory Visit: Payer: Managed Care, Other (non HMO)

## 2019-08-06 DIAGNOSIS — G8929 Other chronic pain: Secondary | ICD-10-CM

## 2019-08-06 DIAGNOSIS — R293 Abnormal posture: Secondary | ICD-10-CM

## 2019-08-06 DIAGNOSIS — M62830 Muscle spasm of back: Secondary | ICD-10-CM

## 2019-08-06 DIAGNOSIS — M545 Low back pain: Secondary | ICD-10-CM | POA: Diagnosis not present

## 2019-08-06 NOTE — Patient Instructions (Signed)
Stand and side lye QL stretching   2x/day 2-3 reps   20-30 sec on RT daily

## 2019-08-06 NOTE — Therapy (Addendum)
[place], [place], [address] Phone: [phone]   Fax:  [phone]  Physical Therapy Treatment/Discharge  Patient Details  Name: Colton Flores MRN: [id number] Date of Birth: 09/09/1985 Referring Provider (PT): Colton Blase, MD   Encounter Date: 08/06/2019  PT End of Session - 08/06/19 1012    Visit Number  4    Number of Visits  12    Date for PT Re-Evaluation  08/16/19    Authorization Type  [employer]    PT Start Time  1010   late 5 min   PT Stop Time  1100    PT Time Calculation (min)  50 min    Activity Tolerance  Patient tolerated treatment well;Patient limited by pain    Behavior During Therapy  [facility] for tasks assessed/performed       Past Medical History:  Diagnosis Date  . Allergy     Past Surgical History:  Procedure Laterality Date  . KNEE SURGERY      There were no vitals filed for this visit.  Subjective Assessment - 08/06/19 1009    Subjective  No hip pain now   still Rt sid eof back    Pain Score  4     Pain Location  Back    Pain Orientation  Right    Pain Descriptors / Indicators  Aching    Pain Type  Chronic pain    Pain Onset  More than a month ago    Pain Frequency  Constant    Aggravating Factors   bending    Pain Relieving Factors  meds                       [facility] Adult PT Treatment/Exercise - 08/06/19 0001      Lumbar Exercises: Stretches   Single Knee to Chest Stretch  Right;2 reps;30 seconds    Single Knee to Chest Stretch Limitations  RT     Lower Trunk Rotation  10 seconds;5 reps    Figure 4 Stretch  2 reps;30 seconds;Supine;With overpressure    Figure 4 Stretch Limitations  push and pull       Lumbar Exercises: Aerobic   Nustep  L5 UE/LE x 6 minutes       Moist Heat Therapy   Number Minutes Moist Heat  12 Minutes    Moist Heat Location  Lumbar Spine      Electrical Stimulation   Electrical Stimulation Location  --    Electrical  Stimulation Action  --    Electrical Stimulation Parameters  --    Electrical Stimulation Goals  --      Manual Therapy   Manual Therapy  Soft tissue mobilization;Passive ROM;Joint mobilization    Joint Mobilization  PA glides to lower thoracici and lumbar central and lateral Gr 3-4     Soft tissue mobilization  RT QL and paraspinals     Passive ROM  ITB stretch and  elongation in Lt side lye              PT Education - 08/06/19 1106    Education Details  HEP    Person(s) Educated  Patient    Methods  Explanation;Demonstration;Verbal cues;Handout;Tactile cues    Comprehension  Verbalized understanding;Returned demonstration       PT Short Term Goals - 08/06/19 1013      PT SHORT TERM GOAL #1   Title  He will be independnet with initial  HEP    Status  Achieved      PT SHORT TERM GOAL #2   Title  He will report pain decreased 10-20% in bilateral lower back    Baseline  now in RT lower back    Status  Achieved      PT SHORT TERM GOAL #3   Title  He will  rpeort pain as intermittant    Baseline  constant    Status  On-going        PT Long Term Goals - 07/08/19 1207      PT LONG TERM GOAL #1   Title  He will be independent with all hEP  issued    Time  6    Period  Weeks    Status  New      PT LONG TERM GOAL #2   Title  He will report pain overall decr 75% or more    Time  6    Period  Weeks    Status  New      PT LONG TERM GOAL #3   Title  His FOTO score will decr to 33% limited    Time  6    Period  Weeks    Status  New      PT LONG TERM GOAL #4   Title  He will return to full duty work    Time  6    Period  Weeks    Status  New      PT LONG TERM GOAL #5   Title  He will demo understanding of good body mechanics with lifting    Time  6    Period  Weeks    Status  New            Plan - 08/06/19 1012    Clinical Impression Statement  No pain just some tingle feeling RT lower back post session. Continues to have pain along iliac shelf  posteriorly. Issued QL stretching for home    PT Treatment/Interventions  Passive range of motion;Dry needling;Therapeutic exercise;Therapeutic activities;Patient/family education;Moist Heat;Electrical Stimulation;Ultrasound;Manual techniques;Taping    PT Next Visit Plan  Review HEP and add as needed. Manual and modalities  for pain.    PT Home Exercise Plan  LTR. Kto C, PPT, piriformis stretching, added Bent knee fall outs with abdominal draw in, QL stretch in side lye and standing    Consulted and Agree with Plan of Care  Patient       Patient will benefit from skilled therapeutic intervention in order to improve the following deficits and impairments:  Decreased range of motion, Pain, Decreased activity tolerance, Increased muscle spasms, Postural dysfunction, Decreased strength  Visit Diagnosis: Chronic bilateral low back pain, unspecified whether sciatica present  Muscle spasm of back  Abnormal posture     Problem List Patient Active Problem List   Diagnosis Date Noted  . Wrist pain 04/14/2011  . Screening cholesterol level 04/14/2011  . Exposure to viral disease 11/17/2010  . SINUSITIS 06/29/2010  . 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 Colton Flores canceled appointments as his employer would no  longer pay for PT services   Remaining deficits: See above   Education / Equipment: HEP Plan: Patient agrees to discharge.  Patient goals were partially met. Patient is being discharged due to financial reasons.  ?????  Colton Flores PT  08/08/19

## 2019-08-08 ENCOUNTER — Ambulatory Visit: Payer: Managed Care, Other (non HMO)

## 2019-08-16 ENCOUNTER — Ambulatory Visit (INDEPENDENT_AMBULATORY_CARE_PROVIDER_SITE_OTHER): Payer: Managed Care, Other (non HMO) | Admitting: Family Medicine

## 2019-08-16 ENCOUNTER — Ambulatory Visit: Payer: Self-pay

## 2019-08-16 ENCOUNTER — Encounter: Payer: Self-pay | Admitting: Family Medicine

## 2019-08-16 ENCOUNTER — Other Ambulatory Visit: Payer: Self-pay

## 2019-08-16 DIAGNOSIS — M5441 Lumbago with sciatica, right side: Secondary | ICD-10-CM

## 2019-08-16 NOTE — Progress Notes (Signed)
   Office Visit Note   Patient: Colton Flores           Date of Birth: April 25, 1986           MRN: [id number] Visit Date: 08/16/2019 Requested by: No referring provider defined for this encounter. PCP: Patient, No Pcp Per  Subjective: Chief Complaint  Patient presents with  . Lower Back - Pain    Still has tightness in the lower back. His workplace closed his claim (and cancelled his PT).    HPI: He is here for follow-up low back pain.  It has been approximately 5 months since his injury at work.  He went to physical therapy and it helped, but Workmen's Comp. will not let him return after 4 visits.  Apparently the Workmen's Comp. physician told him that everything was fine and he should go back to regular work, but he is not able to do that.  He also had x-rays and MRI scan since I saw him, at [facility], but I do not know the results and he does not either.  He continues to have right-sided pain with activity.  No radicular symptoms.               ROS:   All other systems were reviewed and are negative.  Objective: Vital Signs: There were no vitals taken for this visit.  Physical Exam:  General:  Alert and oriented, in no acute distress. Pulm:  Breathing unlabored. Psy:  Normal mood, congruent affect.  Low back: He is tender to palpation in the right posterior hip/lower back.  Negative straight leg raise, lower extremity strength and reflexes are normal.  Imaging: None today  Assessment & Plan: 1.  Persistent right-sided low back pain status post injury at work -I will try to get his MRI for my review.  I wrote him a letter for light duty work for another 4 weeks.  I think he would benefit from continued physical therapy, but he will need to get approval first.     Procedures: No procedures performed  No notes on file     PMFS History: Patient Active Problem List   Diagnosis Date Noted  . Wrist pain 04/14/2011  . Screening cholesterol level 04/14/2011  . Exposure  to viral disease 11/17/2010  . SINUSITIS 06/29/2010  . 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 Allergy     Family History  Problem Relation Age of Onset  . Hypertension Mother   . Diabetes Father   . Hypertension Father   . Hyperlipidemia Father     Past Surgical History:  Procedure Laterality Date  . KNEE SURGERY     Social History   Occupational History  . Not on file  Tobacco Use  . Smoking status: Former Smoker    Types: Cigarettes  . Smokeless tobacco: Never Used  Substance and Sexual Activity  . Alcohol use: Yes    Comment: socially  . Drug use: No  . Sexual activity: Not on file

## 2019-08-20 ENCOUNTER — Telehealth: Payer: Self-pay | Admitting: Family Medicine

## 2019-08-20 NOTE — Telephone Encounter (Signed)
I reviewed his lumbar MRI dated 07-24-19 on CD and agree with radiologist's interpretation that it looks normal.  Pain is most likely muscular.

## 2019-08-21 ENCOUNTER — Encounter (HOSPITAL_COMMUNITY): Payer: Self-pay | Admitting: Emergency Medicine

## 2019-08-21 ENCOUNTER — Ambulatory Visit (HOSPITAL_COMMUNITY)
Admission: EM | Admit: 2019-08-21 | Discharge: 2019-08-21 | Disposition: A | Discharge status: Home / Self Care | Payer: Managed Care, Other (non HMO) | Attending: Family Medicine | Admitting: Family Medicine

## 2019-08-21 ENCOUNTER — Other Ambulatory Visit: Payer: Self-pay

## 2019-08-21 ENCOUNTER — Encounter: Payer: Self-pay | Admitting: Family Medicine

## 2019-08-21 DIAGNOSIS — M545 Low back pain, unspecified: Secondary | ICD-10-CM

## 2019-08-21 DIAGNOSIS — G8929 Other chronic pain: Secondary | ICD-10-CM | POA: Diagnosis not present

## 2019-08-21 MED ORDER — PREDNISONE 10 MG (21) PO TBPK: ORAL_TABLET | Freq: Every day | ORAL | 0 refills | Status: DC

## 2019-08-21 NOTE — ED Triage Notes (Signed)
Pt has been having right mid flank pain that he first had about 5 months ago.  He states it is in the ribcage area and below and he states he had an injury to the area originally.  He was treated here and he states the pain was manageable with treatment.  The pain has returned and it has increased and he states the pain is a bit different this time.

## 2019-08-21 NOTE — ED Provider Notes (Signed)
[facility]   [id number] 08/21/19 Arrival Time: 8469  ASSESSMENT & PLAN:  1. Chronic right-sided low back pain without sciatica   With acute exacerbation.  Has Baclofen and Lodine to take. Meds ordered this encounter  Medications  . predniSONE (STERAPRED UNI-PAK 21 TAB) 10 MG (21) TBPK tablet    Sig: Take by mouth daily. Take as directed.    Dispense:  21 tablet    Refill:  0   Able to ambulate here and hemodynamically stable. No indication for imaging of back at this time given no trauma and normal neurological exam. Discussed. Encourage ROM/movement as tolerated.  Will keep f/u with his doctor. May f/u here as needed. Work note for today provided.  Reviewed expectations re: course of current medical issues. Questions answered. Outlined signs and symptoms indicating need for more acute intervention. Patient verbalized understanding. After Visit Summary given.   SUBJECTIVE: History from: patient.  Colton Flores is a 34 y.o. male who presents with acute on chronic R mid/lower back pain. Present for the past 5 months. Reports injury at work. Has seen worker's comp provider. Is now followed by Dr Prince Rome; seen at [facility] on 08/16/2019. Placed on light duty for four weeks. "Think I stretched too far yesterday while reaching for something at work". Immediate pain. No SOB or CP reported. No extremity sensation changes or weakness. Takes Lodine and Baclofen prn for back pain. Has not taken today. Pain described as aching and without radiation. Aggravating factors: certain movements. Alleviating factors: rest. Progressive LE weakness or saddle anesthesia: none. Extremity sensation changes or weakness: none. Ambulatory without difficulty. Normal bowel/bladder habits: yes; without urinary retention. Normal PO intake without n/v. No associated abdominal pain/n/v.  Reports no chronic steroid use, fevers, IV drug use, or recent back surgeries or  procedures.    OBJECTIVE:  Vitals:   08/21/19 1026  BP: (!) 137/93  Pulse: 77  Resp: 18  Temp: 98 F (36.7 C)  TempSrc: Oral  SpO2: 100%    General appearance: alert; no distress HEENT: ; AT Neck: supple with FROM; without midline tenderness CV: RRR Lungs: unlabored respirations; symmetrical air entry Abdomen: soft, non-tender; non-distended Back: moderate and poorly localized tenderness to palpation over R lateral mid to lower back; FROM at waist; bruising: none; without midline tenderness Extremities: without edema; symmetrical without gross deformities; normal ROM of bilateral LE Skin: warm and dry Neurologic: normal gait; normal sensation and strength of bilateral LE Psychological: alert and cooperative; normal mood and affect  Chart review reveals normal interpretation by Dr Prince Rome of his low back MRI 12/20. Orthopaedist visit dated 08/16/2019 reviewed.    Allergies  Allergen Reactions  . Shellfish Allergy Anaphylaxis  . Iodine     Past Medical History:  Diagnosis Date  . Allergy    Social History   Socioeconomic History  . Marital status: Single    Spouse name: Not on file  . Number of children: Not on file  . Years of education: Not on file  . Highest education level: Not on file  Occupational History  . Not on file  Tobacco Use  . Smoking status: Former Smoker    Types: Cigarettes  . Smokeless tobacco: Never Used  Substance and Sexual Activity  . Alcohol use: Yes    Comment: socially  . Drug use: No  . Sexual activity: Not on file  Other Topics Concern  . Not on file  Social History Narrative  . Not on file   Social Determinants  of Health   Financial Resource Strain:   . Difficulty of Paying Living Expenses: Not on file  Food Insecurity:   . Worried About Charity fundraiser in the Last Year: Not on file  . Ran Out of Food in the Last Year: Not on file  Transportation Needs:   . Lack of Transportation (Medical): Not on file  . Lack  of Transportation (Non-Medical): Not on file  Physical Activity:   . Days of Exercise per Week: Not on file  . Minutes of Exercise per Session: Not on file  Stress:   . Feeling of Stress : Not on file  Social Connections:   . Frequency of Communication with Friends and Family: Not on file  . Frequency of Social Gatherings with Friends and Family: Not on file  . Attends Religious Services: Not on file  . Active Member of Clubs or Organizations: Not on file  . Attends Archivist Meetings: Not on file  . Marital Status: Not on file  Intimate Partner Violence:   . Fear of Current or Ex-Partner: Not on file  . Emotionally Abused: Not on file  . Physically Abused: Not on file  . Sexually Abused: Not on file   Family History  Problem Relation Age of Onset  . Hypertension Mother   . Diabetes Father   . Hypertension Father   . Hyperlipidemia Father    Past Surgical History:  Procedure Laterality Date  . KNEE SURGERY       Vanessa Kick, MD 08/21/19 1050

## 2019-09-04 ENCOUNTER — Ambulatory Visit (HOSPITAL_COMMUNITY)
Admission: EM | Admit: 2019-09-04 | Discharge: 2019-09-04 | Disposition: A | Discharge status: Home / Self Care | Payer: Managed Care, Other (non HMO) | Attending: Family Medicine | Admitting: Family Medicine

## 2019-09-04 ENCOUNTER — Other Ambulatory Visit: Payer: Self-pay

## 2019-09-04 ENCOUNTER — Encounter (HOSPITAL_COMMUNITY): Payer: Self-pay

## 2019-09-04 DIAGNOSIS — M545 Low back pain, unspecified: Secondary | ICD-10-CM

## 2019-09-04 DIAGNOSIS — G8929 Other chronic pain: Secondary | ICD-10-CM | POA: Diagnosis not present

## 2019-09-04 DIAGNOSIS — R030 Elevated blood-pressure reading, without diagnosis of hypertension: Secondary | ICD-10-CM

## 2019-09-04 NOTE — Discharge Instructions (Signed)
Your blood pressure was noted to be elevated during your visit today. You may return here within the next few days to recheck if unable to see your primary care doctor. If your blood pressure remains persistently elevated, you may need to begin taking a medication.  BP (!) 154/74 (BP Location: Left Arm)   Pulse 99   Temp 98.1 F (36.7 C) (Oral)   Resp 18   SpO2 97%

## 2019-09-04 NOTE — ED Triage Notes (Signed)
Pt reports having lower back pain and right hip pain, since September 2020, worse in the past 4 months. Pt reports he Pt reports having numbness in lower back. Pt reports pain increase when he bend over. Pt is taking Baclofen and Lodine with somewhat relief.

## 2019-09-07 NOTE — ED Provider Notes (Signed)
[place]   [id number] 09/04/19 Arrival Time: [place] PLAN:  1. Chronic right-sided low back pain without sciatica   2. Elevated blood pressure reading without diagnosis of hypertension     Able to ambulate here and hemodynamically stable. No indication for imaging of back at this time given no trauma and normal neurological exam. Discussed. Requests work note; 3 days given. Encourage ROM/movement as tolerated.  Recommend: Follow-up Information    Schedule an appointment as soon as possible for a visit  with Eunice Blase, MD.   Specialty: Family Medicine Contact information: [place] [address] [phone]            Discharge Instructions     Your blood pressure was noted to be elevated during your visit today. You may return here within the next few days to recheck if unable to see your primary care doctor. If your blood pressure remains persistently elevated, you may need to begin taking a medication.  BP (!) 154/74 (BP Location: Left Arm)   Pulse 99   Temp 98.1 F (36.7 C) (Oral)   Resp 18   SpO2 97%       Reviewed expectations re: course of current medical issues. Questions answered. Outlined signs and symptoms indicating need for more acute intervention. Patient verbalized understanding. After Visit Summary given.   SUBJECTIVE: History from: patient.  Colton Flores is a 34 y.o. male who presents with continuing chronic R mid/lower back pain. Present for the past 5-6 months. Reports distant injury at work. Pain has not changed much since last visit here on 08/21/2019. No extremity sensation changes or weakness. Takes Lodine and Baclofen prn for back pain without much relief. Pain described as aching and without radiation. Aggravating factors: certain movements. Alleviating factors: rest. Progressive LE weakness or saddle anesthesia: none. Extremity sensation changes or weakness: none. Ambulatory without  difficulty. Normal bowel/bladder habits: yes; without urinary retention. Normal PO intake without n/v. No associated abdominal pain/n/v.  Reports no chronic steroid use, fevers, IV drug use, or recent back surgeries or procedures.  Increased blood pressure noted today. Reports that he has not been treated for hypertension in the past.  He reports no chest pain on exertion, no dyspnea on exertion, no swelling of ankles, no orthostatic dizziness or lightheadedness, no orthopnea or paroxysmal nocturnal dyspnea, no palpitations and no intermittent claudication symptoms.   OBJECTIVE:  Vitals:   09/04/19 1443  BP: (!) 154/74  Pulse: 99  Resp: 18  Temp: 98.1 F (36.7 C)  TempSrc: Oral  SpO2: 97%    General appearance: alert; no distress HEENT: [place]; AT Neck: supple with FROM; without midline tenderness CV: RRR Lungs: unlabored respirations; symmetrical air entry Abdomen: soft, non-tender; non-distended Back: moderate and poorly localized tenderness to palpation over R lateral mid to lower back; FROM at waist; bruising: none; without midline tenderness Extremities: without edema; symmetrical without gross deformities; normal ROM of bilateral LE Skin: warm and dry Neurologic: normal gait; normal sensation and strength of bilateral LE Psychological: alert and cooperative; normal mood and affect    Allergies  Allergen Reactions  . Shellfish Allergy Anaphylaxis  . Iodine     Past Medical History:  Diagnosis Date  . Allergy    Social History   Socioeconomic History  . Marital status: Single    Spouse name: Not on file  . Number of children: Not on file  . Years of education: Not on file  . Highest education level: Not  on file  Occupational History  . Not on file  Tobacco Use  . Smoking status: Former Smoker    Types: Cigarettes  . Smokeless tobacco: Never Used  Substance and Sexual Activity  . Alcohol use: Yes    Comment: socially  . Drug use: No  . Sexual activity:  Not on file  Other Topics Concern  . Not on file  Social History Narrative  . Not on file   Social Determinants of Health   Financial Resource Strain:   . Difficulty of Paying Living Expenses: Not on file  Food Insecurity:   . Worried About Programme researcher, broadcasting/film/video in the Last Year: Not on file  . Ran Out of Food in the Last Year: Not on file  Transportation Needs:   . Lack of Transportation (Medical): Not on file  . Lack of Transportation (Non-Medical): Not on file  Physical Activity:   . Days of Exercise per Week: Not on file  . Minutes of Exercise per Session: Not on file  Stress:   . Feeling of Stress : Not on file  Social Connections:   . Frequency of Communication with Friends and Family: Not on file  . Frequency of Social Gatherings with Friends and Family: Not on file  . Attends Religious Services: Not on file  . Active Member of Clubs or Organizations: Not on file  . Attends Banker Meetings: Not on file  . Marital Status: Not on file  Intimate Partner Violence:   . Fear of Current or Ex-Partner: Not on file  . Emotionally Abused: Not on file  . Physically Abused: Not on file  . Sexually Abused: Not on file   Family History  Problem Relation Age of Onset  . Hypertension Mother   . Diabetes Father   . Hypertension Father   . Hyperlipidemia Father    Past Surgical History:  Procedure Laterality Date  . KNEE SURGERY       Mardella Layman, MD 09/07/19 [phone]

## 2019-09-13 ENCOUNTER — Encounter (HOSPITAL_COMMUNITY): Payer: Self-pay

## 2019-09-13 ENCOUNTER — Ambulatory Visit (HOSPITAL_COMMUNITY)
Admission: EM | Admit: 2019-09-13 | Discharge: 2019-09-13 | Disposition: A | Discharge status: Home / Self Care | Payer: Managed Care, Other (non HMO) | Attending: Family Medicine | Admitting: Family Medicine

## 2019-09-13 ENCOUNTER — Other Ambulatory Visit: Payer: Self-pay

## 2019-09-13 DIAGNOSIS — G43019 Migraine without aura, intractable, without status migrainosus: Secondary | ICD-10-CM | POA: Diagnosis not present

## 2019-09-13 MED ORDER — KETOROLAC TROMETHAMINE 60 MG/2ML IM SOLN
INTRAMUSCULAR | Status: AC
  Filled 2019-09-13: qty 2

## 2019-09-13 MED ORDER — KETOROLAC TROMETHAMINE 60 MG/2ML IM SOLN
60.0000 mg | Freq: Once | INTRAMUSCULAR | Status: AC
  Administered 2019-09-13: 14:00:00 60 mg via INTRAMUSCULAR

## 2019-09-13 MED ORDER — BUTALBITAL-APAP-CAFFEINE 50-325-40 MG PO TABS: 1.0000 | ORAL_TABLET | Freq: Four times a day (QID) | ORAL | 0 refills | Status: AC | PRN

## 2019-09-13 NOTE — ED Provider Notes (Signed)
[facility]    CSN: [id number] Arrival date & time: 09/13/19  1237      History   Chief Complaint Chief Complaint  Patient presents with  . Appointment    1230  . Headache    HPI Colton Flores is a 34 y.o. male.   HPI  Patient is had a right-sided headache for 3 days.  This feels like his usual migraine.  Mild photophobia.  No nausea or vomiting.  No trauma or injury.  No sinus infections.  No fever chills, shortness of breath or signs of Covid.  He feels like this is not an infection.  He has tried some over-the-counter medication.  Is not helping.  He states he has not had a migraine in 2 to 3 years.  He no longer keeps migraine headache at home.  Past Medical History:  Diagnosis Date  . Allergy     Patient Active Problem List   Diagnosis Date Noted  . Wrist pain 04/14/2011  . Screening cholesterol level 04/14/2011  . Exposure to viral disease 11/17/2010  . SINUSITIS 06/29/2010  . 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 KNEE SURGERY         Home Medications    Prior to Admission medications   Medication Sig Start Date End Date Taking? Authorizing Provider  diphenhydrAMINE-APAP, sleep, (TYLENOL PM EXTRA STRENGTH PO) Take by mouth.   Yes [provider]  baclofen (LIORESAL) 10 MG tablet Take 0.5-1 tablets (5-10 mg total) by mouth 3 (three) times daily as needed for muscle spasms. 07/05/19   Hilts, Casimiro Needle, MD  butalbital-acetaminophen-caffeine (FIORICET) [id number] MG tablet Take 1-2 tablets by mouth every 6 (six) hours as needed for headache. 09/13/19 09/12/20  Eustace Moore, MD  etodolac (LODINE) 400 MG tablet Take 1 tablet (400 mg total) by mouth 2 (two) times daily as needed. 07/05/19   Hilts, Casimiro Needle, MD  gabapentin (NEURONTIN) 100 MG capsule 1 PO q HS, may increase to 3 PO q HS if needed 06/11/19   Hilts, Michael, MD    Family History Family History  Problem Relation Age of Onset   . Hypertension Mother   . Diabetes Father   . Hypertension Father   . Hyperlipidemia Father     Social History Social History   Tobacco Use  . Smoking status: Former Smoker    Types: Cigarettes  . Smokeless tobacco: Never Used  Substance Use Topics  . Alcohol use: Yes    Comment: socially  . Drug use: No     Allergies   Shellfish allergy and Iodine   Review of Systems Review of Systems  Constitutional: Negative for chills, diaphoresis, fatigue and fever.  Eyes: Positive for photophobia. Negative for visual disturbance.  Respiratory: Negative for cough and shortness of breath.   Gastrointestinal: Negative for nausea and vomiting.  Neurological: Positive for headaches. Negative for light-headedness.     Physical Exam Triage Vital Signs ED Triage Vitals  Enc Vitals Group     BP 09/13/19 1253 (!) 136/91     Pulse Rate 09/13/19 1253 71     Resp 09/13/19 1253 19     Temp 09/13/19 1253 98.6 F (37 C)     Temp Source 09/13/19 1253 Oral     SpO2 09/13/19 1253 97 %     Weight --      Height --      Head Circumference --      Peak  Flow --      Pain Score 09/13/19 1251 6     Pain Loc --      Pain Edu? --      Excl. in [place]? --    No data found.  Updated Vital Signs BP (!) 136/91 (BP Location: Right Arm)   Pulse 71   Temp 98.6 F (37 C) (Oral)   Resp 19   SpO2 97%      Physical Exam Constitutional:      General: He is not in acute distress.    Appearance: He is well-developed.  HENT:     Head: Normocephalic and atraumatic.     Right Ear: Tympanic membrane and ear canal normal.     Left Ear: Tympanic membrane and ear canal normal.     Nose: Nose normal. No congestion.     Mouth/Throat:     Mouth: Mucous membranes are moist.     Pharynx: Oropharynx is clear.  Eyes:     Conjunctiva/sclera: Conjunctivae normal.     Pupils: Pupils are equal, round, and reactive to light.  Cardiovascular:     Rate and Rhythm: Normal rate and regular rhythm.     Heart  sounds: Normal heart sounds.  Pulmonary:     Effort: Pulmonary effort is normal. No respiratory distress.     Breath sounds: Normal breath sounds.  Abdominal:     General: There is no distension.  Musculoskeletal:        General: Normal range of motion.     Cervical back: Normal range of motion.  Lymphadenopathy:     Cervical: No cervical adenopathy.  Skin:    General: Skin is warm and dry.  Neurological:     Mental Status: He is alert. Mental status is at baseline.     Cranial Nerves: No cranial nerve deficit.     Coordination: Coordination normal.     Gait: Gait normal.  Psychiatric:        Mood and Affect: Mood normal.    Nonfocal neuro exam.  Patient appears in good health although mildly uncomfortable.  UC Treatments / Results  Labs (all labs ordered are listed, but only abnormal results are displayed) Labs Reviewed - No data to display  EKG   Radiology No results found.  Procedures Procedures (including critical care time)  Medications Ordered in UC Medications  ketorolac (TORADOL) injection 60 mg (60 mg Intramuscular Given 09/13/19 1335)    Initial Impression / Assessment and Plan / UC Course  I have reviewed the triage vital signs and the nursing notes.  Pertinent labs & imaging results that were available during my care of the patient were reviewed by me and considered in my medical decision making (see chart for details).     Treated with a Toradol injection, followed with pain medication if needed.  Note for work Final Clinical Impressions(s) / UC Diagnoses   Final diagnoses:  Intractable migraine without aura and without status migrainosus     Discharge Instructions     Home to rest If the headache comes back try fioricet   ED Prescriptions    Medication Sig Dispense Auth. Provider   butalbital-acetaminophen-caffeine (FIORICET) 50-325-40 MG tablet Take 1-2 tablets by mouth every 6 (six) hours as needed for headache. 20 tablet Raylene Everts, MD     I have reviewed the PDMP during this encounter.   Raylene Everts, MD 09/13/19 2114

## 2019-09-13 NOTE — ED Triage Notes (Signed)
Pt reports having right-sided headache x 2 days. Tylenol PM gives somewhat relief.

## 2019-09-13 NOTE — Discharge Instructions (Signed)
Home to rest If the headache comes back try fioricet

## 2019-09-19 ENCOUNTER — Other Ambulatory Visit: Payer: Self-pay

## 2019-09-19 ENCOUNTER — Ambulatory Visit
Admission: EM | Admit: 2019-09-19 | Discharge: 2019-09-19 | Disposition: A | Discharge status: Home / Self Care | Payer: Managed Care, Other (non HMO)

## 2019-09-19 DIAGNOSIS — M545 Low back pain, unspecified: Secondary | ICD-10-CM

## 2019-09-19 DIAGNOSIS — M25551 Pain in right hip: Secondary | ICD-10-CM | POA: Diagnosis not present

## 2019-09-19 DIAGNOSIS — G8929 Other chronic pain: Secondary | ICD-10-CM

## 2019-09-19 NOTE — ED Triage Notes (Signed)
Pt states hx of chronic lower back and rt hip pain. States pain got worse while working last night. 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 States now pain is radiating to lt hip. Denies injury.

## 2019-09-19 NOTE — Discharge Instructions (Signed)
Start etodolac twice a day for 5-7 days. Once pain returns to baseline, can switch to once 30 min prior to work. Follow up with PCP/orthopedics if symptoms does not improve, worsens.

## 2019-09-19 NOTE — ED Provider Notes (Signed)
[facility]    CSN: [id number] Arrival date & time: 09/19/19  1023      History   Chief Complaint Chief Complaint  Patient presents with  . Back Pain    HPI Colton Flores is a 34 y.o. male.   34 year old male comes in for flare up of chronic right lower back/hip pain. States was initially seeing PT and orthopedics through worker's comp, but was then cleared to go back to full duty. States pain never resolved, but was mild compared to before. States pain worsened while working last night with heavy lifting. Denies new injury/trauma. Pain now also radiating to left hip at times. He has been taking baclofen daily once prior to sleeping, and etodolac as needed.      Past Medical History:  Diagnosis Date  . Allergy     Patient Active Problem List   Diagnosis Date Noted  . Wrist pain 04/14/2011  . Screening cholesterol level 04/14/2011  . Exposure to viral disease 11/17/2010  . SINUSITIS 06/29/2010  . 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 KNEE SURGERY         Home Medications    Prior to Admission medications   Medication Sig Start Date End Date Taking? Authorizing Provider  baclofen (LIORESAL) 10 MG tablet Take 0.5-1 tablets (5-10 mg total) by mouth 3 (three) times daily as needed for muscle spasms. 07/05/19   Hilts, Legrand Como, MD  butalbital-acetaminophen-caffeine (FIORICET) [phone] MG tablet Take 1-2 tablets by mouth every 6 (six) hours as needed for headache. 09/13/19 09/12/20  Raylene Everts, MD  diphenhydrAMINE-APAP, sleep, (TYLENOL PM EXTRA STRENGTH PO) Take by mouth.    [provider]  etodolac (LODINE) 400 MG tablet Take 1 tablet (400 mg total) by mouth 2 (two) times daily as needed. 07/05/19   Hilts, Legrand Como, MD  gabapentin (NEURONTIN) 100 MG capsule 1 PO q HS, may increase to 3 PO q HS if needed 06/11/19   Hilts, Michael, MD    Family History Family History  Problem Relation Age  of Onset  . Hypertension Mother   . Diabetes Father   . Hypertension Father   . Hyperlipidemia Father     Social History Social History   Tobacco Use  . Smoking status: Former Smoker    Types: Cigarettes  . Smokeless tobacco: Never Used  Substance Use Topics  . Alcohol use: Yes    Comment: socially  . Drug use: No     Allergies   Shellfish allergy and Iodine   Review of Systems Review of Systems  Reason unable to perform ROS: See HPI as above.     Physical Exam Triage Vital Signs ED Triage Vitals [09/19/19 1032]  Enc Vitals Group     BP (!) 140/101     Pulse Rate 82     Resp 18     Temp 98.2 F (36.8 C)     Temp Source Oral     SpO2 98 %     Weight      Height      Head Circumference      Peak Flow      Pain Score 6     Pain Loc      Pain Edu?      Excl. in [place]?    No data found.  Updated Vital Signs BP (!) 140/101 (BP Location: Left Arm)   Pulse 82   Temp 98.2 F (  36.8 C) (Oral)   Resp 18   SpO2 98%   Physical Exam Constitutional:      General: He is not in acute distress.    Appearance: He is well-developed. He is not diaphoretic.  HENT:     Head: Normocephalic and atraumatic.  Eyes:     Conjunctiva/sclera: Conjunctivae normal.     Pupils: Pupils are equal, round, and reactive to light.  Cardiovascular:     Rate and Rhythm: Normal rate and regular rhythm.     Heart sounds: Normal heart sounds. No murmur. No friction rub. No gallop.   Pulmonary:     Effort: Pulmonary effort is normal. No accessory muscle usage or respiratory distress.     Breath sounds: Normal breath sounds. No stridor. No decreased breath sounds, wheezing, rhonchi or rales.  Musculoskeletal:     Comments: No tenderness on palpation of the spinous processes. Tenderness to palpation of right thoracic back. No obvious tenderness to palpation of hips. Full range of motion. Strength normal and equal bilaterally. Sensation intact and equal bilaterally. Negative straight leg  raise.  Patient able to ambulate on own without difficulty. No changes in gait  Skin:    General: Skin is warm and dry.  Neurological:     Mental Status: He is alert and oriented to person, place, and time.      UC Treatments / Results  Labs (all labs ordered are listed, but only abnormal results are displayed) Labs Reviewed - No data to display  EKG   Radiology No results found.  Procedures Procedures (including critical care time)  Medications Ordered in UC Medications - No data to display  Initial Impression / Assessment and Plan / UC Course  I have reviewed the triage vital signs and the nursing notes.  Pertinent labs & imaging results that were available during my care of the patient were reviewed by me and considered in my medical decision making (see chart for details).    Will have patient take etodolac consistently BID for 5-7 days, after pain improves to baseline, to take once 30-52mins prior to shift to see if better control of symptoms. Return precautions given. Patient expresses understanding and agrees to plan.  Final Clinical Impressions(s) / UC Diagnoses   Final diagnoses:  Chronic right-sided low back pain without sciatica  Chronic pain of right hip   ED Prescriptions    None     PDMP not reviewed this encounter.   Belinda Fisher, PA-C 09/19/19 1104

## 2019-09-25 ENCOUNTER — Other Ambulatory Visit: Payer: Self-pay

## 2019-09-25 ENCOUNTER — Ambulatory Visit (HOSPITAL_COMMUNITY)
Admission: EM | Admit: 2019-09-25 | Discharge: 2019-09-25 | Disposition: A | Discharge status: Home / Self Care | Payer: Managed Care, Other (non HMO) | Attending: Family Medicine | Admitting: Family Medicine

## 2019-09-25 ENCOUNTER — Encounter (HOSPITAL_COMMUNITY): Payer: Self-pay

## 2019-09-25 DIAGNOSIS — S29012A Strain of muscle and tendon of back wall of thorax, initial encounter: Secondary | ICD-10-CM | POA: Diagnosis not present

## 2019-09-25 NOTE — ED Triage Notes (Signed)
Pt presents with complaints of back pain for "awhile". Reports having a back injury last year in September, reports ongoing issues on and off since. States the pain is normally on his right side and he now is feeling it on his left too. Movement makes it worse. Reports minimally relief with otc treatment.

## 2019-09-25 NOTE — ED Provider Notes (Signed)
[facility]   [id number] 09/25/19 Arrival Time: 1233  ASSESSMENT & PLAN:  1. Strain of mid-back, initial encounter     No indication for plain imaging of back today. He relates current back issues to his employer putting him back on full duties.  Work note provided. Again discussed there is not much more I can offer him here.  Recommend: Follow-up Information    Schedule an appointment as soon as possible for a visit  with Lavada Mesi, MD.   Specialty: Family Medicine Contact information: [address] [place] [address] [phone]           Reviewed expectations re: course of current medical issues. Questions answered. Outlined signs and symptoms indicating need for more acute intervention. Patient verbalized understanding. After Visit Summary given.   SUBJECTIVE: History from: patient.  Colton INTRIAGO is a 34 y.o. male whom I've seen a few times for chronic R-sided back pain. Initially a worker's comp claim but reports worker's comp has ended the claim. Has seen Dr Prince Rome for further care and is planning on following up with him. He presents today with complaint of left mid back discomfort. Noticed after working full duties; had been on light duties. No trauma reported. Ambulatory without difficulty. No extremity sensation changes or weakness. No chest pain or SOB reported. Taking medications as directed. Missed work today secondary to current symptoms.    OBJECTIVE:  Vitals:   09/25/19 1321  BP: (!) 134/92  Pulse: 69  Resp: 18  Temp: 98.2 F (36.8 C)  SpO2: 98%    General appearance: alert; no distress HEENT: [place]; AT Neck: supple with FROM; without midline tenderness Lungs: unlabored respirations; symmetrical air entry Abdomen: soft, non-tender; non-distended Back: mild  and poorly localized tenderness to palpation over left thoracic back musculature; FROM at waist; bruising: none; without midline tenderness Extremities: without edema;  symmetrical without gross deformities; normal ROM of bilateral LE Skin: warm and dry Neurologic: normal gait Psychological: alert and cooperative; normal mood and affect    Allergies  Allergen Reactions  . Shellfish Allergy Anaphylaxis  . Iodine     Past Medical History:  Diagnosis Date  . Allergy    Social History   Socioeconomic History  . Marital status: Single    Spouse name: Not on file  . Number of children: Not on file  . Years of education: Not on file  . Highest education level: Not on file  Occupational History  . Not on file  Tobacco Use  . Smoking status: Former Smoker    Types: Cigarettes  . Smokeless tobacco: Never Used  Substance and Sexual Activity  . Alcohol use: Yes    Comment: socially  . Drug use: No  . Sexual activity: Not on file  Other Topics Concern  . Not on file  Social History Narrative  . Not on file   Social Determinants of Health   Financial Resource Strain:   . Difficulty of Paying Living Expenses: Not on file  Food Insecurity:   . Worried About Programme researcher, broadcasting/film/video in the Last Year: Not on file  . Ran Out of Food in the Last Year: Not on file  Transportation Needs:   . Lack of Transportation (Medical): Not on file  . Lack of Transportation (Non-Medical): Not on file  Physical Activity:   . Days of Exercise per Week: Not on file  . Minutes of Exercise per Session: Not on file  Stress:   . Feeling of  Stress : Not on file  Social Connections:   . Frequency of Communication with Friends and Family: Not on file  . Frequency of Social Gatherings with Friends and Family: Not on file  . Attends Religious Services: Not on file  . Active Member of Clubs or Organizations: Not on file  . Attends Archivist Meetings: Not on file  . Marital Status: Not on file  Intimate Partner Violence:   . Fear of Current or Ex-Partner: Not on file  . Emotionally Abused: Not on file  . Physically Abused: Not on file  . Sexually Abused:  Not on file   Family History  Problem Relation Age of Onset  . Hypertension Mother   . Diabetes Father   . Hypertension Father   . Hyperlipidemia Father    Past Surgical History:  Procedure Laterality Date  . KNEE SURGERY       Vanessa Kick, MD 09/25/19 [phone]

## 2019-10-01 ENCOUNTER — Other Ambulatory Visit: Payer: Self-pay

## 2019-10-01 ENCOUNTER — Encounter: Payer: Self-pay | Admitting: Family Medicine

## 2019-10-01 ENCOUNTER — Ambulatory Visit (INDEPENDENT_AMBULATORY_CARE_PROVIDER_SITE_OTHER): Payer: Managed Care, Other (non HMO) | Admitting: Family Medicine

## 2019-10-01 DIAGNOSIS — M5441 Lumbago with sciatica, right side: Secondary | ICD-10-CM | POA: Diagnosis not present

## 2019-10-01 NOTE — Progress Notes (Addendum)
   Office Visit Note   Patient: Colton Flores           Date of Birth: Dec 19, 1985           MRN: [id number] Visit Date: 10/01/2019 Requested by: No referring provider defined for this encounter. PCP: Patient, No Pcp Per  Subjective: Chief Complaint  Patient presents with  . Lower Back - Pain    "Old injury that's not wanting to heal." Pain across lower back, with occasional "tightness" in the right thigh.     HPI: He is here for follow-up low back pain.  It has been approximately 7 months since his injury at work. He has been attempting to work since MRI report was normal and [employer] physician told him to return to regular work. He reports that his right sided back pain worsens during work and at the end of work his back is spasming and painful. He is not having any radicular symptoms. He has been to urgent care 5 separate times in the past 2 month due to back pain after work. He feels like so far PT has helped the most with his pain. He was only able to complete 4 sessions of physical therapy as Workmen's Comp did let him not let him return after 4 visits.  He has been trying baclofen for muscle spasm but it takes a while to help.               ROS:   All other systems were reviewed and are negative.  Objective: Vital Signs: There were no vitals taken for this visit.  Physical Exam:  General:  Alert and oriented, in no acute distress. Pulm:  Breathing unlabored. Psy:  Normal mood, congruent affect.  Low back: He is tender to palpation in the right posterior hip/lower back, very tense muscles.  Negative straight leg raise, lower extremity strength and reflexes are normal. Normal gait  Imaging: None today  Assessment & Plan: 1.  Persistent right-sided low back pain status post injury at work - MRI with no abnormality, no neuropathic symptoms suggesting herniated disc. Suspect that he has tightness and musculoskeletal pain that will improve with rest and PT, as his pain has  been recurrently flaring with work. Will keep him out of work until June 15 while he re-starts physical therapy and continues home stretching. Will continue to see him monthly until June 15.   Procedures: No procedures performed  No notes on file     PMFS History: Patient Active Problem List   Diagnosis Date Noted  . Wrist pain 04/14/2011  . Screening cholesterol level 04/14/2011  . Exposure to viral disease 11/17/2010  . SINUSITIS 06/29/2010  . 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 Allergy     Family History  Problem Relation Age of Onset  . Hypertension Mother   . Diabetes Father   . Hypertension Father   . Hyperlipidemia Father     Past Surgical History:  Procedure Laterality Date  . KNEE SURGERY     Social History   Occupational History  . Not on file  Tobacco Use  . Smoking status: Former Smoker    Types: Cigarettes  . Smokeless tobacco: Never Used  Substance and Sexual Activity  . Alcohol use: Yes    Comment: socially  . Drug use: No  . Sexual activity: Not on file

## 2019-10-01 NOTE — Progress Notes (Signed)
I saw and examined the patient with Dr. Robby Sermon and agree with assessment and plan as outlined.    He continues to have low back pain almost 6 months status post injury at work.  MRI was negative for disc herniation.  Exam has been consistent with muscular tenderness in the lower back and posterior hip as well as some tightness.  He was improving with physical therapy but Workmen's Comp. no longer approves it.  He has continued to have flareups of pain requiring visits to the ER 5 times since last visit.  In order to allow for adequate healing, we will take him out of work until June 15 while he pursues aggressive physical therapy.  He will do home exercises as well, and will also walk on a regular basis for exercise.  I will see him back monthly until June 15, sooner for any problems.

## 2019-10-10 ENCOUNTER — Ambulatory Visit: Payer: Managed Care, Other (non HMO) | Attending: Internal Medicine

## 2019-10-10 DIAGNOSIS — Z23 Encounter for immunization: Secondary | ICD-10-CM

## 2019-10-10 NOTE — Progress Notes (Signed)
   Covid-19 Vaccination Clinic  Name:  Colton Flores    MRN: [id number] DOB: October 11, 1985  10/10/2019  [name] was observed post Covid-19 immunization for 15 minutes without incident. He was provided with Vaccine Information Sheet and instruction to access the V-Safe system.   [name] was instructed to call 911 with any severe reactions post vaccine: Marland Kitchen Difficulty breathing  . Swelling of face and throat  . A fast heartbeat  . A bad rash all over body  . Dizziness and weakness   Immunizations Administered    Name Date Dose VIS Date Route   Moderna COVID-19 Vaccine 10/10/2019  4:15 PM 0.5 mL 06/25/2019 Intramuscular   Manufacturer: Moderna   Lot: [id number]   NDC: 68873-730-81

## 2019-10-24 ENCOUNTER — Encounter: Payer: Self-pay | Admitting: Family Medicine

## 2019-10-24 ENCOUNTER — Other Ambulatory Visit: Payer: Self-pay

## 2019-10-24 ENCOUNTER — Ambulatory Visit (INDEPENDENT_AMBULATORY_CARE_PROVIDER_SITE_OTHER): Payer: Managed Care, Other (non HMO) | Admitting: Family Medicine

## 2019-10-24 DIAGNOSIS — M5441 Lumbago with sciatica, right side: Secondary | ICD-10-CM

## 2019-10-24 NOTE — Progress Notes (Signed)
   Office Visit Note   Patient: Colton Flores           Date of Birth: 1985-10-04           MRN: [id number] Visit Date: 10/24/2019 Requested by: No referring provider defined for this encounter. PCP: Patient, No Pcp Per  Subjective: Chief Complaint  Patient presents with  . Lower Back - Pain    Back is doing "not too well." Not restarted PT yet. No new pain - still has the right-sided low back and hip pain.    HPI: He is almost 53-month status post injury at work resulting in low back pain.  Since last visit he has been unable to start physical therapy because he does not have a paycheck coming in and he cannot afford to pay for the visits.  He is trying to do workouts on his own but continues to have right-sided lower back pain whenever he does any repetitive bending or twisting.              ROS:   All other systems were reviewed and are negative.  Objective: Vital Signs: There were no vitals taken for this visit.  Physical Exam:  General:  Alert and oriented, in no acute distress. Pulm:  Breathing unlabored. Psy:  Normal mood, congruent affect.  Low back: He remains tender to palpation in the right lower lumbar musculature near the iliac crest.  He has full range of motion, but pain when forward flexing and with side bending and twisting.  Imaging: None today  Assessment & Plan: 1.  Chronic right-sided low back pain almost 28-month status post injury at work, most likely myofascial pain secondary to his injury. -He will stay out of work as we discussed last visit, while he pursues physical therapy.  Hopefully with adequate therapy and myofascial release techniques, his muscular injury will heal to the point that he can return to work again.  I will see him back in about a month for recheck.     Procedures: No procedures performed  No notes on file     PMFS History: Patient Active Problem List   Diagnosis Date Noted  . Wrist pain 04/14/2011  . Screening cholesterol  level 04/14/2011  . Exposure to viral disease 11/17/2010  . SINUSITIS 06/29/2010  . 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 Allergy     Family History  Problem Relation Age of Onset  . Hypertension Mother   . Diabetes Father   . Hypertension Father   . Hyperlipidemia Father     Past Surgical History:  Procedure Laterality Date  . KNEE SURGERY     Social History   Occupational History  . Not on file  Tobacco Use  . Smoking status: Former Smoker    Types: Cigarettes  . Smokeless tobacco: Never Used  Substance and Sexual Activity  . Alcohol use: Yes    Comment: socially  . Drug use: No  . Sexual activity: Not on file

## 2019-10-30 ENCOUNTER — Ambulatory Visit: Payer: Managed Care, Other (non HMO) | Admitting: Family Medicine

## 2019-11-12 ENCOUNTER — Ambulatory Visit: Payer: Managed Care, Other (non HMO) | Attending: Family

## 2019-11-12 DIAGNOSIS — Z23 Encounter for immunization: Secondary | ICD-10-CM

## 2019-11-12 NOTE — Progress Notes (Signed)
   Cov[facility] Vaccination [facility]  Name:  Colton Flores    MRN: [id number] DOB: 1985/09/26  11/12/2019  [name] was observed post Covid-19 immunization for 15 minutes without incident. He was provided with Vaccine Information Sheet and instruction to access the V-Safe system.   [name] was instructed to call 911 with any severe reactions post vaccine: Marland Kitchen Difficulty breathing  . Swelling of face and throat  . A fast heartbeat  . A bad rash all over body  . Dizziness and weakness   Immunizations Administered    Name Date Dose VIS Date Route   Moderna COVID-19 Vaccine 11/12/2019  1:16 PM 0.5 mL 06/2019 Intramuscular   Manufacturer: Moderna   Lot: [id number]   NDC: 07680-881-10

## 2020-06-30 IMAGING — CR DG CHEST 2V
2 series · 2 of 2 positions shown · non-contrast
Comparison: 01/09/2010

CLINICAL DATA: Chest pain for 2 days

EXAM:
CHEST - 2 VIEW

[chest pa]
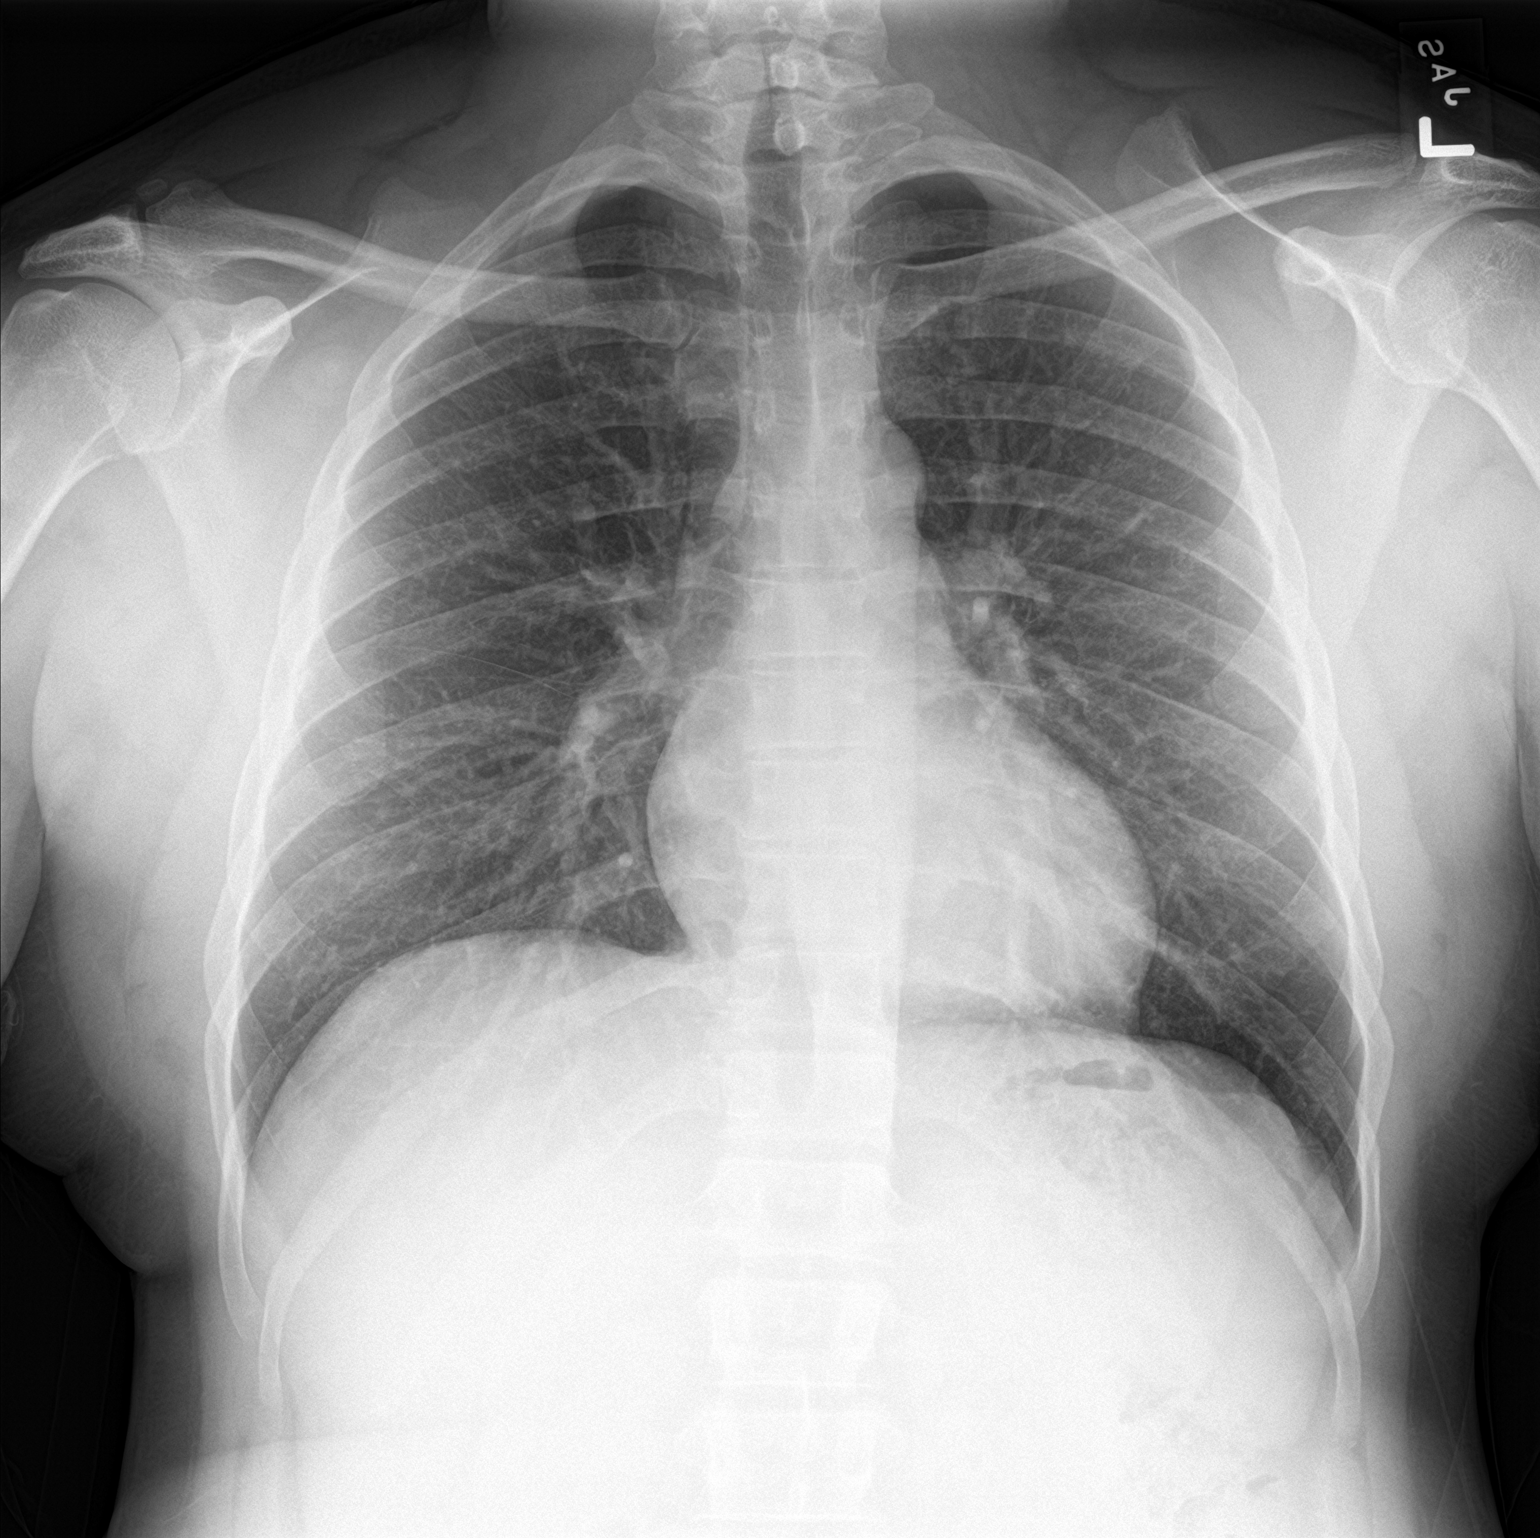

[chest lat]
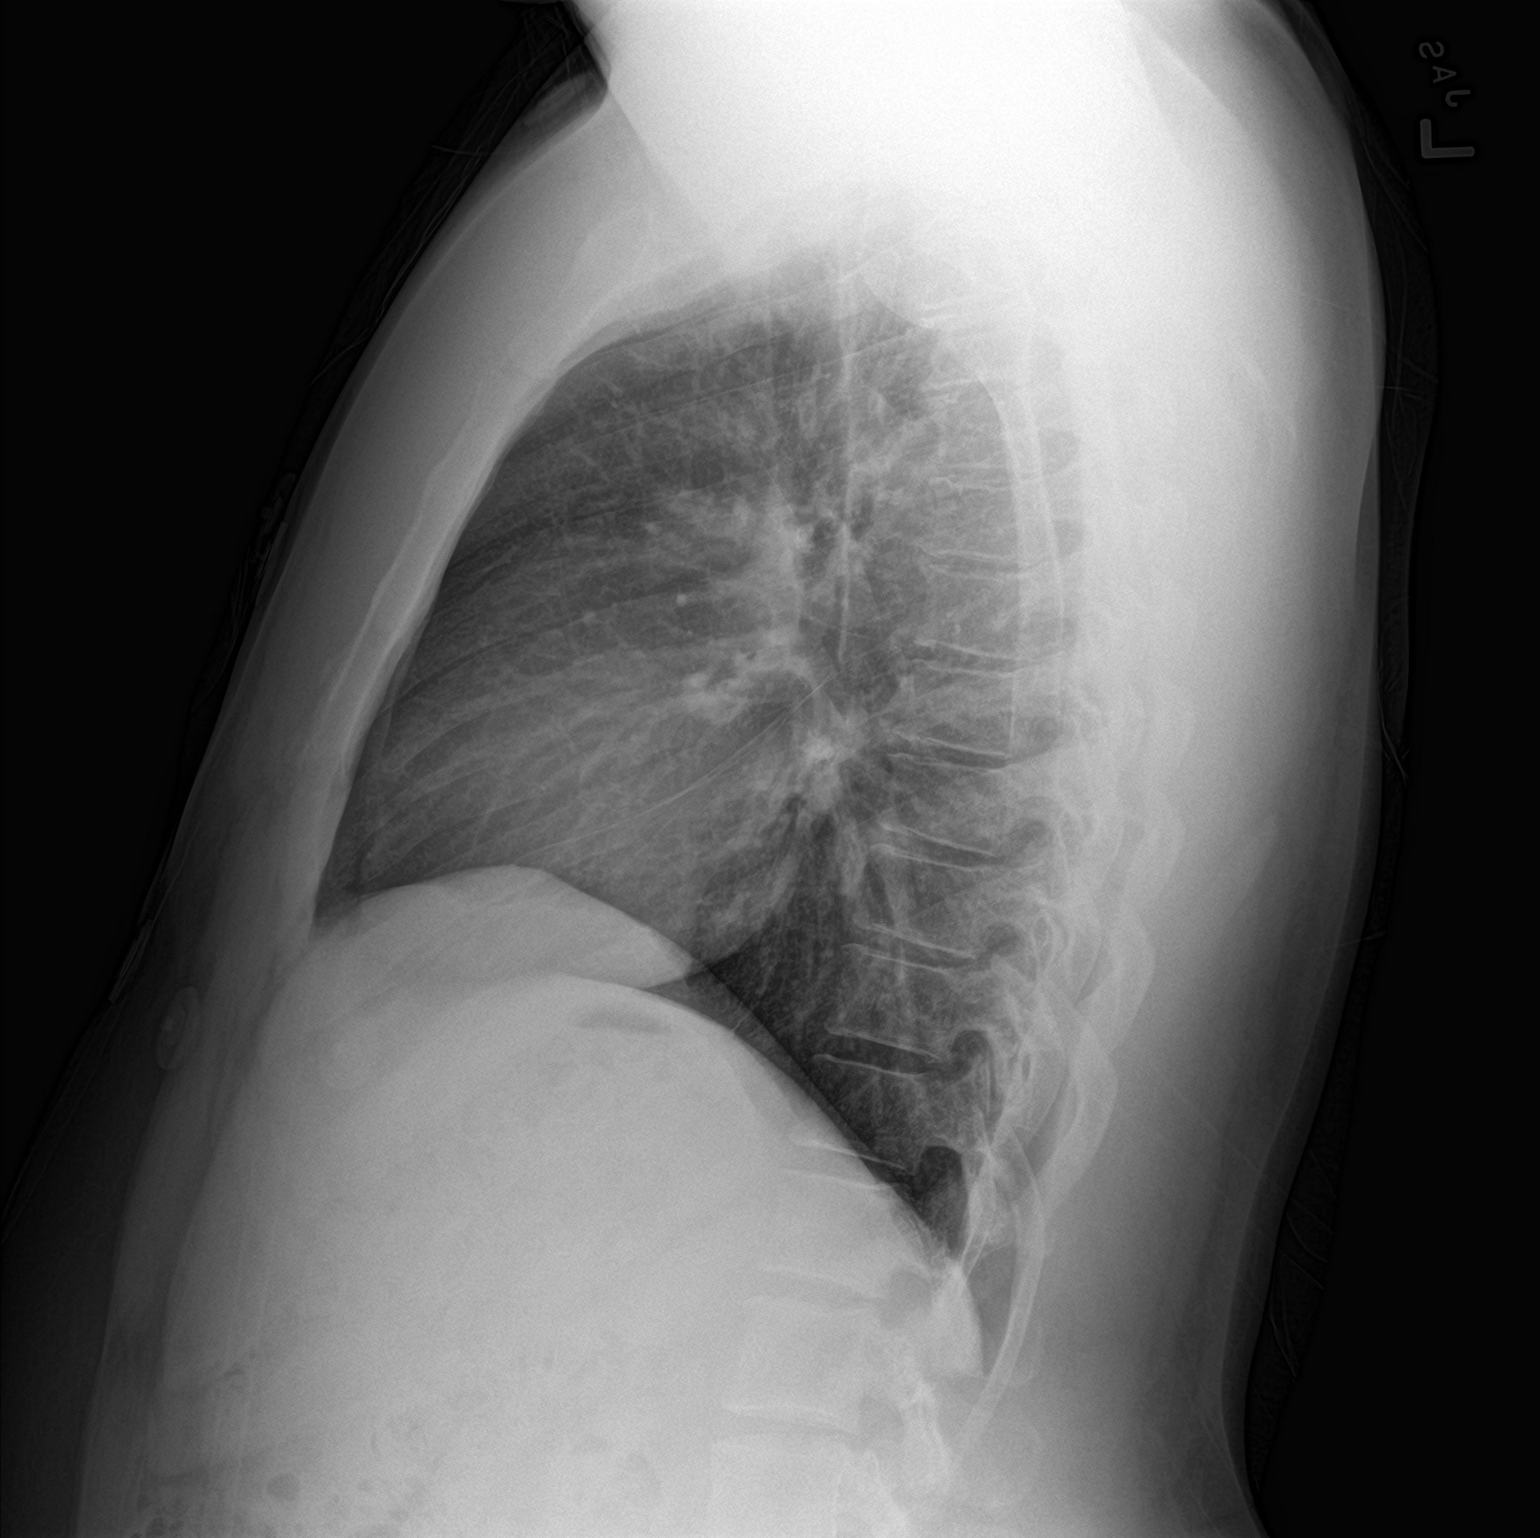

[2 of 2 positions shown; findings below may reference images not displayed]

FINDINGS: Normal heart size and mediastinal contours. No acute infiltrate or
edema. No effusion or pneumothorax. No acute osseous findings.
IMPRESSION: Negative chest.

## 2020-12-13 IMAGING — DX LEFT FOOT - COMPLETE 3+ VIEW
3 series · 3 of 3 positions shown · non-contrast
Comparison: None.

CLINICAL DATA: Foot injury 1 week ago. Fell through broken palate.
Pain 2 plantar surface near the ball of the foot. No notable foreign
body.

EXAM:
LEFT FOOT - COMPLETE 3+ VIEW

[foot ap]
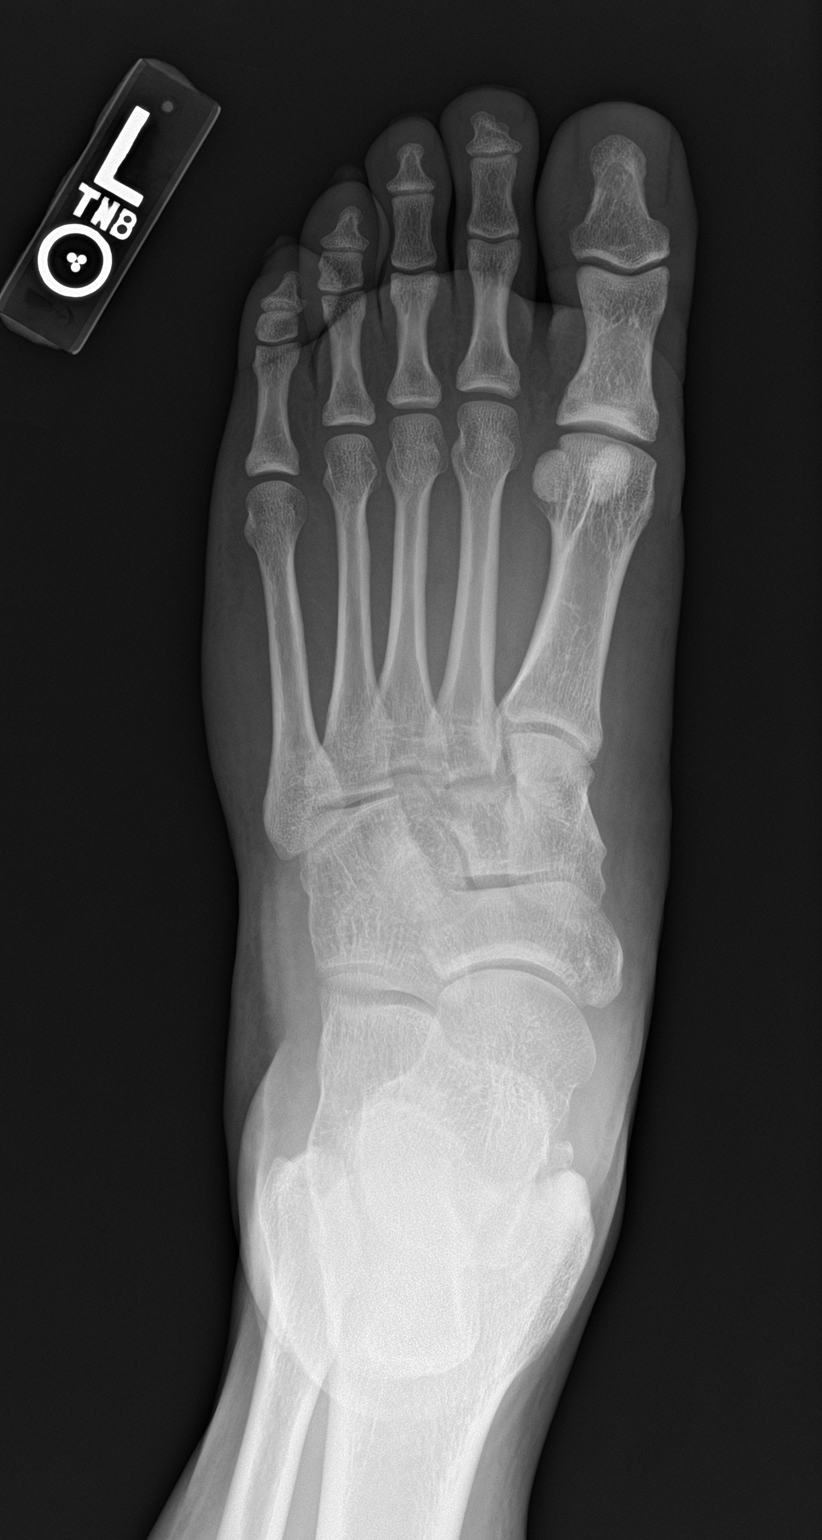

[foot obl]
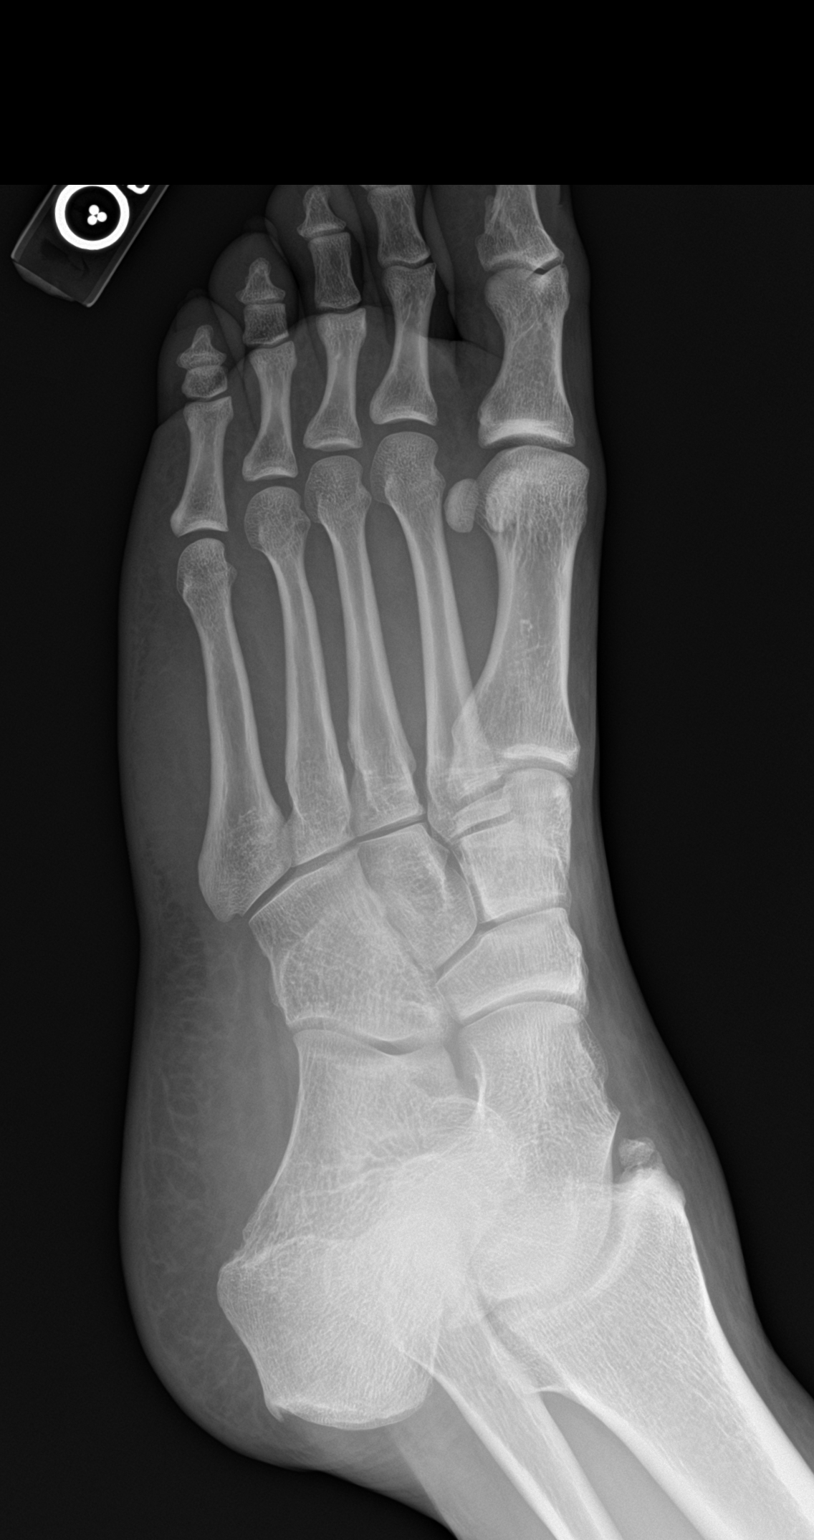

[foot lat]
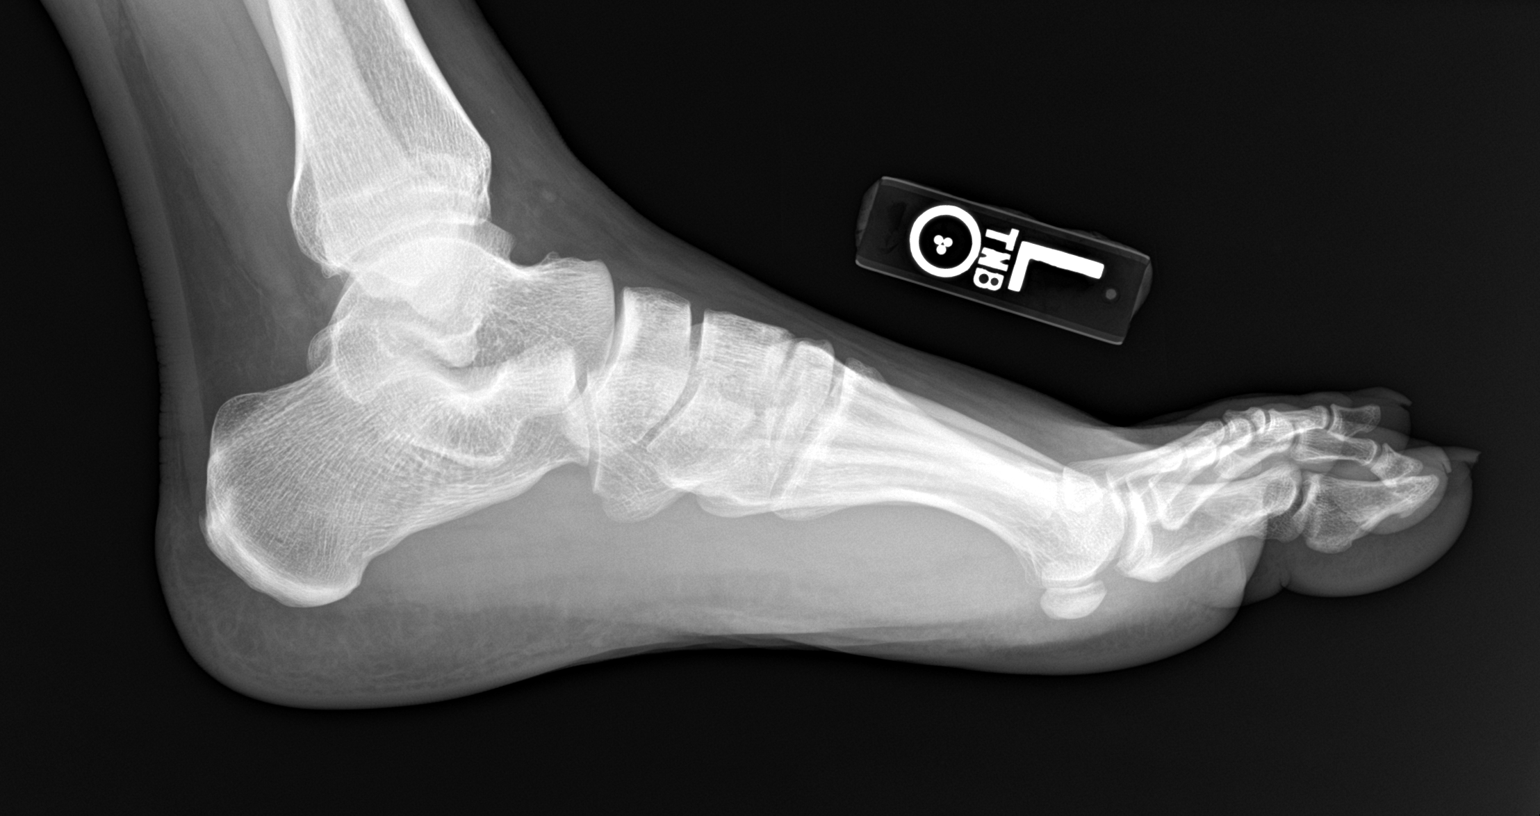

[3 of 3 positions shown; findings below may reference images not displayed]

FINDINGS: There is a small osseous fragment adjacent to the medial malleolus
which is likely related to an old remote injury. There is soft
tissue swelling about the foot without evidence of a radiopaque
foreign body or acute displaced fracture or dislocation.
IMPRESSION: Soft tissue swelling without evidence of an acute osseous
abnormality or radiopaque foreign body.

## 2021-06-11 DIAGNOSIS — M722 Plantar fascial fibromatosis: Secondary | ICD-10-CM | POA: Insufficient documentation

## 2021-09-10 DIAGNOSIS — M79671 Pain in right foot: Secondary | ICD-10-CM | POA: Insufficient documentation

## 2022-10-06 ENCOUNTER — Encounter (HOSPITAL_COMMUNITY): Payer: Self-pay | Admitting: Emergency Medicine

## 2022-10-06 ENCOUNTER — Ambulatory Visit (HOSPITAL_COMMUNITY)
Admission: EM | Admit: 2022-10-06 | Discharge: 2022-10-06 | Disposition: A | Discharge status: Home / Self Care | Payer: Managed Care, Other (non HMO) | Attending: Family Medicine | Admitting: Family Medicine

## 2022-10-06 ENCOUNTER — Other Ambulatory Visit: Payer: Self-pay

## 2022-10-06 ENCOUNTER — Ambulatory Visit: Payer: Managed Care, Other (non HMO)

## 2022-10-06 DIAGNOSIS — J0100 Acute maxillary sinusitis, unspecified: Secondary | ICD-10-CM

## 2022-10-06 MED ORDER — BENZONATATE 200 MG PO CAPS: 200.0000 mg | ORAL_CAPSULE | Freq: Three times a day (TID) | ORAL | 0 refills | Status: DC | PRN

## 2022-10-06 MED ORDER — AMOXICILLIN-POT CLAVULANATE 875-125 MG PO TABS: 1.0000 | ORAL_TABLET | Freq: Two times a day (BID) | ORAL | 0 refills | Status: DC

## 2022-10-06 NOTE — ED Provider Notes (Signed)
[place]    CSN: [id number] Arrival date & time: 10/06/22  [id number]      History   Chief Complaint Chief Complaint  Patient presents with   URI    HPI Colton Flores is a 37 y.o. male.   He is here for URI symptoms.  Has had a cough x 5 days, getting worse.  Sinus congestion, drainage and sore throat.  Some headaches off/on.  No fevers/chills. Some nausea.  Using a nasal spray without help.        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 Authorizing Provider  baclofen (LIORESAL) 10 MG tablet Take 0.5-1 tablets (5-10 mg total) by mouth 3 (three) times daily as needed for muscle spasms. Patient not taking: Reported on 10/01/2019 07/05/19   Hilts, Michael, MD  diphenhydrAMINE-APAP, sleep, (TYLENOL PM EXTRA STRENGTH PO) Take by mouth.    [provider]  EC-NAPROXEN 500 MG EC tablet Take 500 mg by mouth 2 (two) times daily as needed. 08/26/19   [provider]  etodolac (LODINE) 400 MG tablet Take 1 tablet (400 mg total) by mouth 2 (two) times daily as needed. Patient not taking: Reported on 10/01/2019 07/05/19   Hilts, Legrand Como, MD  gabapentin (NEURONTIN) 100 MG capsule 1 PO q HS, may increase to 3 PO q HS if needed Patient not taking: Reported on 10/01/2019 06/11/19   Hilts, Legrand Como, MD  orphenadrine (NORFLEX) 100 MG tablet Take 100 mg by mouth 2 (two) times daily as needed. 08/26/19   [provider]    Family History Family History  Problem Relation Age of Onset   Hypertension Mother    Diabetes Father    Hypertension Father     Hyperlipidemia Father     Social History Social History   Tobacco Use   Smoking status: Former    Types: Cigarettes   Smokeless tobacco: Never  Vaping Use   Vaping Use: Never used  Substance Use Topics   Alcohol use: Yes    Comment: socially   Drug use: No     Allergies   Shellfish allergy and Iodine   Review of Systems Review of Systems  Constitutional:  Negative for chills and fever.  HENT:  Positive for congestion, rhinorrhea and sore throat.   Respiratory:  Positive for cough.   Gastrointestinal: Negative.   Musculoskeletal: Negative.   Psychiatric/Behavioral: Negative.       Physical Exam Triage Vital Signs ED Triage Vitals  Enc Vitals Group     BP 10/06/22 0845 (!) 146/89     Pulse Rate 10/06/22 0845 82     Resp 10/06/22 0845 19     Temp 10/06/22 0845 98.4 F (36.9 C)     Temp Source 10/06/22 0845 Oral     SpO2 10/06/22 0845 98 %     Weight 10/06/22 0846 230 lb (104.3 kg)     Height 10/06/22 0846 '5\' 7"'$  (1.702 m)     Head Circumference --      Peak Flow --  Pain Score 10/06/22 0846 6     Pain Loc --      Pain Edu? --      Excl. in [place]? --    No data found.  Updated Vital Signs BP (!) 146/89 (BP Location: Right Arm)   Pulse 82   Temp 98.4 F (36.9 C) (Oral)   Resp 19   Ht '5\' 7"'$  (1.702 m)   Wt 104.3 kg   SpO2 98%   BMI 36.02 kg/m   Visual Acuity Right Eye Distance:   Left Eye Distance:   Bilateral Distance:    Right Eye Near:   Left Eye Near:    Bilateral Near:     Physical Exam Constitutional:      Appearance: Normal appearance.  HENT:     Head: Normocephalic.     Right Ear: Tympanic membrane normal.     Left Ear: Tympanic membrane normal.     Nose: Congestion and rhinorrhea present.     Right Sinus: Maxillary sinus tenderness present.     Left Sinus: Maxillary sinus tenderness present.  Cardiovascular:     Rate and Rhythm: Normal rate and regular rhythm.  Pulmonary:     Effort: Pulmonary effort is normal.     Breath  sounds: Normal breath sounds.  Musculoskeletal:     Cervical back: Normal range of motion and neck supple. No tenderness.  Lymphadenopathy:     Cervical: No cervical adenopathy.  Skin:    General: Skin is warm.  Neurological:     General: No focal deficit present.     Mental Status: He is alert.  Psychiatric:        Mood and Affect: Mood normal.      UC Treatments / Results  Labs (all labs ordered are listed, but only abnormal results are displayed) Labs Reviewed - No data to display  EKG   Radiology No results found.  Procedures Procedures (including critical care time)  Medications Ordered in UC Medications - No data to display  Initial Impression / Assessment and Plan / UC Course  I have reviewed the triage vital signs and the nursing notes.  Pertinent labs & imaging results that were available during my care of the patient were reviewed by me and considered in my medical decision making (see chart for details).   Final Clinical Impressions(s) / UC Diagnoses   Final diagnoses:  Acute non-recurrent maxillary sinusitis     Discharge Instructions      You were seen today for acute sinusitis.  I have sent out augmentin to take twice/day x 10 days, and a medication to help with cough.  Please get plenty of rest and fluids.  Return if not improving by the end of treatment.     ED Prescriptions     Medication Sig Dispense Auth. Provider   amoxicillin-clavulanate (AUGMENTIN) 875-125 MG tablet Take 1 tablet by mouth every 12 (twelve) hours. 14 tablet Koral Thaden, MD   benzonatate (TESSALON) 200 MG capsule Take 1 capsule (200 mg total) by mouth 3 (three) times daily as needed for cough. 21 capsule Rondel Oh, MD      PDMP not reviewed this encounter.   Rondel Oh, MD 10/06/22 [phone]

## 2022-10-06 NOTE — Discharge Instructions (Signed)
You were seen today for acute sinusitis.  I have sent out augmentin to take twice/day x 10 days, and a medication to help with cough.  Please get plenty of rest and fluids.  Return if not improving by the end of treatment.

## 2022-10-06 NOTE — ED Triage Notes (Signed)
Pt c/o increase cough and congestion over a weak with sore throat.

## 2023-08-07 ENCOUNTER — Ambulatory Visit (INDEPENDENT_AMBULATORY_CARE_PROVIDER_SITE_OTHER): Payer: Self-pay

## 2023-08-07 ENCOUNTER — Ambulatory Visit
Admission: EM | Admit: 2023-08-07 | Discharge: 2023-08-07 | Disposition: A | Discharge status: Home / Self Care | Payer: Self-pay | Attending: Family Medicine | Admitting: Family Medicine

## 2023-08-07 DIAGNOSIS — M25561 Pain in right knee: Secondary | ICD-10-CM

## 2023-08-07 DIAGNOSIS — R053 Chronic cough: Secondary | ICD-10-CM

## 2023-08-07 MED ORDER — PANTOPRAZOLE SODIUM 40 MG PO TBEC: 40.0000 mg | DELAYED_RELEASE_TABLET | Freq: Every day | ORAL | 0 refills | Status: AC

## 2023-08-07 MED ORDER — DICLOFENAC SODIUM 75 MG PO TBEC: 75.0000 mg | DELAYED_RELEASE_TABLET | Freq: Two times a day (BID) | ORAL | 0 refills | Status: AC

## 2023-08-07 NOTE — ED Triage Notes (Signed)
 I have had this Cough for over a month and it isn't going away. No runny nose. No sob. No wheezing. No history of Asthma.   This knee pain (right) started after injury about/around 10 days ago. I was walking and slipped, sliding (no fall) hurting the knee. Pain since. No swelling known. No decreased ROM.
